# Patient Record
Sex: Female | Born: 1960
Health system: Southern US, Community
[De-identification: ages and names within clinical notes are randomized; demographics above are authoritative.]

## PROBLEM LIST (undated history)

## (undated) DIAGNOSIS — C4490 Unspecified malignant neoplasm of skin, unspecified: Secondary | ICD-10-CM

## (undated) DIAGNOSIS — K219 Gastro-esophageal reflux disease without esophagitis: Secondary | ICD-10-CM

## (undated) DIAGNOSIS — T884XXA Failed or difficult intubation, initial encounter: Secondary | ICD-10-CM

## (undated) DIAGNOSIS — N8500 Endometrial hyperplasia, unspecified: Secondary | ICD-10-CM

## (undated) HISTORY — PX: OTHER SURGICAL HISTORY: SHX169

---

## 1997-08-01 ENCOUNTER — Ambulatory Visit (HOSPITAL_COMMUNITY): Admission: RE | Admit: 1997-08-01 | Discharge: 1997-08-01 | Payer: Self-pay | Admitting: Obstetrics and Gynecology

## 1998-06-27 ENCOUNTER — Ambulatory Visit (HOSPITAL_COMMUNITY): Admission: RE | Admit: 1998-06-27 | Discharge: 1998-06-27 | Payer: Self-pay | Admitting: Gastroenterology

## 1998-10-22 ENCOUNTER — Other Ambulatory Visit: Admission: RE | Admit: 1998-10-22 | Discharge: 1998-10-22 | Payer: Self-pay | Admitting: Obstetrics and Gynecology

## 1999-11-20 ENCOUNTER — Other Ambulatory Visit: Admission: RE | Admit: 1999-11-20 | Discharge: 1999-11-20 | Payer: Self-pay | Admitting: Obstetrics and Gynecology

## 2000-01-19 ENCOUNTER — Ambulatory Visit (HOSPITAL_COMMUNITY): Admission: RE | Admit: 2000-01-19 | Discharge: 2000-01-19 | Payer: Self-pay | Admitting: Family Medicine

## 2000-01-19 ENCOUNTER — Encounter: Payer: Self-pay | Admitting: Family Medicine

## 2001-02-20 ENCOUNTER — Other Ambulatory Visit: Admission: RE | Admit: 2001-02-20 | Discharge: 2001-02-20 | Payer: Self-pay | Admitting: Obstetrics and Gynecology

## 2002-04-27 ENCOUNTER — Other Ambulatory Visit: Admission: RE | Admit: 2002-04-27 | Discharge: 2002-04-27 | Payer: Self-pay | Admitting: Obstetrics and Gynecology

## 2003-05-24 ENCOUNTER — Other Ambulatory Visit: Admission: RE | Admit: 2003-05-24 | Discharge: 2003-05-24 | Payer: Self-pay | Admitting: Obstetrics and Gynecology

## 2003-09-09 ENCOUNTER — Ambulatory Visit (HOSPITAL_COMMUNITY): Admission: RE | Admit: 2003-09-09 | Discharge: 2003-09-09 | Payer: Self-pay | Admitting: Gastroenterology

## 2003-09-09 ENCOUNTER — Encounter (INDEPENDENT_AMBULATORY_CARE_PROVIDER_SITE_OTHER): Payer: Self-pay | Admitting: *Deleted

## 2004-07-09 ENCOUNTER — Other Ambulatory Visit: Admission: RE | Admit: 2004-07-09 | Discharge: 2004-07-09 | Payer: Self-pay | Admitting: Obstetrics and Gynecology

## 2004-09-29 ENCOUNTER — Ambulatory Visit (HOSPITAL_COMMUNITY): Admission: RE | Admit: 2004-09-29 | Discharge: 2004-09-29 | Payer: Self-pay | Admitting: Obstetrics and Gynecology

## 2004-09-29 ENCOUNTER — Encounter (INDEPENDENT_AMBULATORY_CARE_PROVIDER_SITE_OTHER): Payer: Self-pay | Admitting: *Deleted

## 2006-10-25 ENCOUNTER — Ambulatory Visit (HOSPITAL_COMMUNITY): Admission: RE | Admit: 2006-10-25 | Discharge: 2006-10-25 | Payer: Self-pay | Admitting: Gastroenterology

## 2009-09-03 ENCOUNTER — Other Ambulatory Visit: Admission: RE | Admit: 2009-09-03 | Discharge: 2009-09-03 | Payer: Self-pay | Admitting: Family Medicine

## 2009-10-16 ENCOUNTER — Encounter: Admission: RE | Admit: 2009-10-16 | Discharge: 2009-10-16 | Payer: Self-pay | Admitting: Family Medicine

## 2010-06-23 NOTE — Op Note (Signed)
[name], Hannah Woodward                 ACCOUNT NO.:  [id number]   MEDICAL RECORD NO.:  [id number]          PATIENT TYPE:  AMB   LOCATION:  ENDO                         FACILITY:  [facility]   PHYSICIAN:  John C. Madilyn Fireman, M.D.    DATE OF BIRTH:  1960/05/09   DATE OF PROCEDURE:  10/25/2006  DATE OF DISCHARGE:                               OPERATIVE REPORT   PROCEDURE:  Colonoscopy.   ENDOSCOPIST:  Everardo All. Madilyn Fireman, M.D.   INDICATIONS FOR PROCEDURE:  History of adenomatous colon polyps.   PROCEDURE:  The patient was placed in the left lateral decubitus  position and placed on the pulse monitor with continuous low-flow oxygen  delivered by nasal cannula.  She was sedated with 100 mcg of IV fentanyl  and 10 mg of IV Versed.  The Olympus video colonoscope was inserted into  the rectum and advanced to cecum, confirmed by transillumination of  McBurney's point and visualization of the ileocecal valve and  appendiceal orifice.  Prep was excellent.  The cecum, ascending,  transverse, descending and sigmoid colon all appeared normal with no  masses, polyps, diverticula or other mucosal abnormalities.  The rectum  likewise appeared normal.  In retroflexed view, the anus revealed no  obvious internal hemorrhoids.  Scope was then withdrawn and the patient  returned to the recovery room in stable condition.  She tolerated the  procedure well and there were no immediate complications.   IMPRESSION:  Normal colonoscopy.   PLAN:  Repeat study in 5 years.           ______________________________  Everardo All Madilyn Fireman, M.D.     JCH/MEDQ  D:  10/25/2006  T:  10/25/2006  Job:  [id number]

## 2010-06-26 NOTE — Op Note (Signed)
NAMEVIVIEN, Hannah Woodward                 ACCOUNT NO.:  [id number]   MEDICAL RECORD NO.:  [id number]          PATIENT TYPE:  AMB   LOCATION:  SDC                           FACILITY:  [facility]   PHYSICIAN:  Duke Salvia. Marcelle Overlie, M.D.DATE OF BIRTH:  February 06, 1961   DATE OF PROCEDURE:  09/29/2004  DATE OF DISCHARGE:                                 OPERATIVE REPORT   PREOPERATIVE DIAGNOSES:  1.  Abnormal uterine bleeding.  2.  Leiomyoma.   POSTOPERATIVE DIAGNOSES:  1.  Abnormal uterine bleeding.  2.  Leiomyoma.   PROCEDURE:  Dilatation and curettage, attempted NovaSure endometrial  ablation.   SURGEON:  Duke Salvia. Marcelle Overlie, M.D.   ANESTHESIA:  Sedation plus paracervical block.   SPECIMENS REMOVED:  Endometrial curetting.   PROCEDURE AND FINDINGS:  The patient was taken to the operating room.  After  an adequate level of sedation was obtained with the patient's legs in  stirrups, the perineum and vagina were prepped and draped with Betadine.  The bladder was drained, EUA carried out.  The uterus was six weeks' size,  midposition, with excellent support.  Cervix was grasped with a tenaculum,  paracervical block created by infiltrating at 3 and 9 o'clock submucosally.  Lidocaine 1% 5-7 mL on either side after negative aspiration.  The uterus  was then sounded to 9.5-10 cm, was acutely anteflexed.  The cervix was very  tight.  She was dilated to a #27 to 29 Pratt dilator, but there was an acute  angulation of the cervix at the internal os forward.  Cervical length was  2.5-3 cm.  Despite dilating to a 29 Pratt dilator, I could not get the  NovaSure instrument to negotiate the acute angle of her internal os after  several attempts.  There was minimal bleeding.  The procedure was terminated  at that point.  She tolerated this well, went to the recovery room in good  condition.      Richard M. Marcelle Overlie, M.D.  Electronically Signed     RMH/MEDQ  D:  09/29/2004  T:  09/29/2004  Job:  [id number]

## 2010-06-26 NOTE — H&P (Signed)
Hannah Woodward, Hannah Woodward                 ACCOUNT NO.:  [id number]   MEDICAL RECORD NO.:  [id number]          PATIENT TYPE:  AMB   LOCATION:  SDC                           FACILITY:  [facility]   PHYSICIAN:  Duke Salvia. Marcelle Overlie, M.D.DATE OF BIRTH:  10/15/1960   DATE OF ADMISSION:  09/29/2004  DATE OF DISCHARGE:                                HISTORY & PHYSICAL   CHIEF COMPLAINT:  Menorrhagia.   HISTORY OF PRESENT ILLNESS:  A 50 year old G2, P2, prior tubal with a one  year history of menorrhagia.  Her FSH was normal at 7.4.  Sonohistogram done  in June of this year showed several small fibroids 1.1 and 1.2 and a  negative cavity evaluation by saline infusion.  She has declined other  attempts to hormonally regulate her bleeding and presents now for NovaSure  EMA.  This procedure, including risks of bleeding, infection, other  complications that may require additional surgery all discussed, along with  the expected outcome results, 40% amenorrhea rate 80-90% hypomenorrhea rate,  which she understands and accepts.   PAST MEDICAL HISTORY:   ALLERGIES:  None.   OPERATIONS:  1.  Tubal.  2.  Cesarean section.   FAMILY HISTORY:  Significant for her father had colon can and her mother  with breast cancer.   She has been up to date, both on her colonoscopy and mammography screening.   PHYSICAL EXAMINATION:  VITAL SIGNS:  Temperature 98.2, blood pressure  120/68.  HEENT:  Unremarkable.  NECK:  Supple, without masses.  LUNGS:  Clear.  CARDIOVASCULAR:  Regular rate and rhythm without murmurs, rubs or gallops.  BREASTS:  Without masses.  ABDOMEN:  Soft, flat and nontender.  PELVIC:  Normal external genitalia.  Vagina and cervix clear.  Uterus normal  position, normal size.  Adnexa negative.  EXTREMITIES:  Unremarkable.  NEUROLOGIC:  Unremarkable.   IMPRESSION:  Menorrhagia.   PLAN:  NovaSure EMA procedure.  Risks reviewed as above.      Richard M. Marcelle Overlie, M.D.  Electronically  Signed     RMH/MEDQ  D:  09/28/2004  T:  09/28/2004  Job:  [id number]

## 2010-06-26 NOTE — Op Note (Signed)
NAMESEPHORA, Hannah Woodward                           ACCOUNT NO.:  [id number]   MEDICAL RECORD NO.:  [id number]                   PATIENT TYPE:  AMB   LOCATION:  ENDO                                 FACILITY:  [facility]   PHYSICIAN:  John C. Madilyn Fireman, M.D.                 DATE OF BIRTH:  13-Nov-1960   DATE OF PROCEDURE:  09/09/2003  DATE OF DISCHARGE:                                 OPERATIVE REPORT   Esophagogastroduodenoscopy.   INDICATION FOR PROCEDURE:  Intermittent solid food dysphagia.   PROCEDURE:  This patient was placed in the left lateral decubitus position  and placed on pulse monitor with continuous low flow oxygen delivered by  nasal cannula.  She was sedated with 75 mcg IV fentanyl and 7 mg IV Versed.  The Olympus video endoscope was advanced under direct vision into the  oropharynx and esophagus.  The esophagus was straight and of normal caliber  with squamocolumnar line at 38 cm.  Although it was subtle, there did appear  to be a minimal lower esophageal ring with no resistance with passage of the  scope beyond.  The stomach was entered and a small amount of liquid  secretions were suctioned from the fundus.  A retroflexed view of the cardia  was unremarkable.  The fundus, body, antrum and pylorus all appeared normal.  The duodenum was entered and both the bulb and second portion were well  inspected and appeared to be within normal limits.  A Savary guide wire was  placed through the endoscope channel and the scope withdrawn.  A single 18  mm Savary dilator was passed over the guide wire to moderate resistance and  removed together with the wire.  There was minimal blood seen on withdrawal.  The patient was then prepared for colonoscopy as previously scheduled.  She  tolerated the procedure well and there were no immediate complications.   IMPRESSION:  Subtle lower esophageal ring with dysphagia status post  dilatation to 18 mm.   PLAN:  Advance diet and observe response to  dilatation.                                               John C. Madilyn Fireman, M.D.    JCH/MEDQ  D:  09/09/2003  T:  09/09/2003  Job:  [id number]   cc:   Donia Guiles, M.D.  [address] [place]  [place] [address]  Fax: [phone]

## 2010-06-26 NOTE — Op Note (Signed)
Hannah Woodward, Hannah Woodward                           ACCOUNT NO.:  [id number]   MEDICAL RECORD NO.:  [id number]                   PATIENT TYPE:  AMB   LOCATION:  ENDO                                 FACILITY[facility]  [facility]   PHYSICIAN:  John C. Madilyn Fireman, M.D.                 DATE OF BIRTH:  1960/02/15   DATE OF PROCEDURE:  09/09/2003  DATE OF DISCHARGE:                                 OPERATIVE REPORT   PROCEDURE PERFORMED:  Colonoscopy.   ENDOSCOPIST:  Barrie Folk, M.D.   INDICATIONS FOR PROCEDURE:  Family history of colon cancer in a first degree  relative, with last colonoscopy five years ago.   DESCRIPTION OF PROCEDURE:  The patient was placed in the left lateral  decubitus position and placed on the pulse monitor with continuous low-flow  oxygen delivered by nasal cannula.  The patient was sedated with 25 mcg of  IV fentanyl and 3 mg of IV Versed in addition to the medicines given for the  previous EGD.  The Olympus video colonoscope was inserted into the rectum  and advanced to the cecum, confirmed by transillumination of McBurney's  point and visualization of the ileocecal valve and appendiceal orifice.  The  prep was excellent.  The cecum appeared normal with no masses, polyps,  diverticula or other mucosal abnormalities.  In the ascending colon there  was an 8 mm sessile polyp that was fulgurated by hot biopsy.  The remainder  of the ascending, transverse, descending, sigmoid and rectum appeared normal  with no further masses, polyps, diverticula or other mucosal abnormalities.  The scope was then withdrawn and the patient returned to the recovery room  in stable condition.  She tolerated the procedure well.  There were no  immediate complications.   IMPRESSION:  Ascending colon polyp.   PLAN:  Await histology and will probably repeat study in three to five years  depending on the histology.                                               John C. Madilyn Fireman, M.D.    JCH/MEDQ  D:   09/09/2003  T:  09/09/2003  Job:  [id number]

## 2010-08-31 ENCOUNTER — Other Ambulatory Visit: Payer: Self-pay | Admitting: Dermatology

## 2010-10-06 ENCOUNTER — Other Ambulatory Visit: Payer: Self-pay | Admitting: Family Medicine

## 2010-10-06 DIAGNOSIS — Z1231 Encounter for screening mammogram for malignant neoplasm of breast: Secondary | ICD-10-CM

## 2010-10-23 ENCOUNTER — Other Ambulatory Visit (HOSPITAL_COMMUNITY)
Admission: RE | Admit: 2010-10-23 | Discharge: 2010-10-23 | Disposition: A | Discharge status: Home / Self Care | Payer: 59 | Source: Ambulatory Visit | Attending: Family Medicine | Admitting: Family Medicine

## 2010-10-23 DIAGNOSIS — Z124 Encounter for screening for malignant neoplasm of cervix: Secondary | ICD-10-CM | POA: Insufficient documentation

## 2010-10-28 ENCOUNTER — Other Ambulatory Visit: Payer: Self-pay | Admitting: Physician Assistant

## 2010-10-28 ENCOUNTER — Ambulatory Visit
Admission: RE | Admit: 2010-10-28 | Discharge: 2010-10-28 | Disposition: A | Discharge status: Home / Self Care | Payer: 59 | Source: Ambulatory Visit | Attending: Family Medicine | Admitting: Family Medicine

## 2010-10-28 DIAGNOSIS — Z1231 Encounter for screening mammogram for malignant neoplasm of breast: Secondary | ICD-10-CM

## 2011-09-27 ENCOUNTER — Other Ambulatory Visit: Payer: Self-pay | Admitting: Family Medicine

## 2011-09-27 DIAGNOSIS — Z1231 Encounter for screening mammogram for malignant neoplasm of breast: Secondary | ICD-10-CM

## 2011-10-29 ENCOUNTER — Ambulatory Visit
Admission: RE | Admit: 2011-10-29 | Discharge: 2011-10-29 | Disposition: A | Discharge status: Home / Self Care | Payer: 59 | Source: Ambulatory Visit | Attending: Family Medicine | Admitting: Family Medicine

## 2011-10-29 ENCOUNTER — Other Ambulatory Visit: Payer: Self-pay | Admitting: Physician Assistant

## 2011-10-29 ENCOUNTER — Other Ambulatory Visit (HOSPITAL_COMMUNITY)
Admission: RE | Admit: 2011-10-29 | Discharge: 2011-10-29 | Disposition: A | Discharge status: Home / Self Care | Payer: 59 | Source: Ambulatory Visit | Attending: Family Medicine | Admitting: Family Medicine

## 2011-10-29 DIAGNOSIS — Z124 Encounter for screening for malignant neoplasm of cervix: Secondary | ICD-10-CM | POA: Insufficient documentation

## 2011-10-29 DIAGNOSIS — Z1231 Encounter for screening mammogram for malignant neoplasm of breast: Secondary | ICD-10-CM

## 2012-03-25 ENCOUNTER — Other Ambulatory Visit: Payer: Self-pay

## 2012-09-04 ENCOUNTER — Other Ambulatory Visit (HOSPITAL_COMMUNITY): Payer: Self-pay | Admitting: Physician Assistant

## 2012-09-04 DIAGNOSIS — R109 Unspecified abdominal pain: Secondary | ICD-10-CM

## 2012-09-05 ENCOUNTER — Ambulatory Visit (HOSPITAL_COMMUNITY)
Admission: RE | Admit: 2012-09-05 | Discharge: 2012-09-05 | Disposition: A | Discharge status: Home / Self Care | Payer: 59 | Source: Ambulatory Visit | Attending: Physician Assistant | Admitting: Physician Assistant

## 2012-09-05 DIAGNOSIS — R110 Nausea: Secondary | ICD-10-CM | POA: Insufficient documentation

## 2012-09-05 DIAGNOSIS — Z85828 Personal history of other malignant neoplasm of skin: Secondary | ICD-10-CM | POA: Insufficient documentation

## 2012-09-05 DIAGNOSIS — K7689 Other specified diseases of liver: Secondary | ICD-10-CM | POA: Insufficient documentation

## 2012-09-05 DIAGNOSIS — R109 Unspecified abdominal pain: Secondary | ICD-10-CM | POA: Insufficient documentation

## 2012-09-06 ENCOUNTER — Other Ambulatory Visit (HOSPITAL_COMMUNITY): Payer: 59

## 2012-09-07 ENCOUNTER — Other Ambulatory Visit (HOSPITAL_COMMUNITY): Payer: Self-pay | Admitting: Physician Assistant

## 2012-09-07 DIAGNOSIS — R935 Abnormal findings on diagnostic imaging of other abdominal regions, including retroperitoneum: Secondary | ICD-10-CM

## 2012-09-08 ENCOUNTER — Ambulatory Visit (HOSPITAL_COMMUNITY)
Admission: RE | Admit: 2012-09-08 | Discharge: 2012-09-08 | Disposition: A | Discharge status: Home / Self Care | Payer: 59 | Source: Ambulatory Visit | Attending: Physician Assistant | Admitting: Physician Assistant

## 2012-09-08 DIAGNOSIS — R935 Abnormal findings on diagnostic imaging of other abdominal regions, including retroperitoneum: Secondary | ICD-10-CM

## 2012-09-08 DIAGNOSIS — K7689 Other specified diseases of liver: Secondary | ICD-10-CM | POA: Insufficient documentation

## 2012-09-08 MED ORDER — GADOXETATE DISODIUM 0.25 MMOL/ML IV SOLN
9.0000 mL | Freq: Once | INTRAVENOUS | Status: AC | PRN
  Administered 2012-09-08: 9 mL via INTRAVENOUS

## 2012-11-09 ENCOUNTER — Other Ambulatory Visit: Payer: Self-pay

## 2012-11-09 DIAGNOSIS — Z1231 Encounter for screening mammogram for malignant neoplasm of breast: Secondary | ICD-10-CM

## 2012-11-10 ENCOUNTER — Ambulatory Visit
Admission: RE | Admit: 2012-11-10 | Discharge: 2012-11-10 | Disposition: A | Discharge status: Home / Self Care | Payer: 59 | Source: Ambulatory Visit

## 2012-11-10 DIAGNOSIS — Z1231 Encounter for screening mammogram for malignant neoplasm of breast: Secondary | ICD-10-CM

## 2012-12-14 ENCOUNTER — Other Ambulatory Visit: Payer: Self-pay

## 2013-01-24 ENCOUNTER — Other Ambulatory Visit: Payer: Self-pay | Admitting: *Deleted

## 2013-01-24 DIAGNOSIS — R002 Palpitations: Secondary | ICD-10-CM

## 2013-01-24 NOTE — Progress Notes (Signed)
24 hour holter monitor ordered at the request of Dr Maurice Small for palpitations.

## 2013-01-29 ENCOUNTER — Encounter (INDEPENDENT_AMBULATORY_CARE_PROVIDER_SITE_OTHER): Payer: 59

## 2013-01-29 ENCOUNTER — Encounter: Payer: Self-pay | Admitting: *Deleted

## 2013-01-29 DIAGNOSIS — R002 Palpitations: Secondary | ICD-10-CM

## 2013-01-29 NOTE — Progress Notes (Signed)
Patient ID: Hannah Woodward, female   DOB: 02/27/1960, 52 y.o.   MRN: [id number] E-Cardio 24 hour holter monitor applied to patient.

## 2013-12-28 ENCOUNTER — Other Ambulatory Visit: Payer: Self-pay

## 2013-12-28 DIAGNOSIS — Z1231 Encounter for screening mammogram for malignant neoplasm of breast: Secondary | ICD-10-CM

## 2014-01-25 ENCOUNTER — Ambulatory Visit
Admission: RE | Admit: 2014-01-25 | Discharge: 2014-01-25 | Disposition: A | Discharge status: Home / Self Care | Payer: 59 | Source: Ambulatory Visit

## 2014-01-25 DIAGNOSIS — Z1231 Encounter for screening mammogram for malignant neoplasm of breast: Secondary | ICD-10-CM

## 2014-10-30 ENCOUNTER — Other Ambulatory Visit: Payer: Self-pay | Admitting: Gastroenterology

## 2014-11-05 ENCOUNTER — Ambulatory Visit (HOSPITAL_COMMUNITY)
Admission: RE | Admit: 2014-11-05 | Discharge: 2014-11-05 | Disposition: A | Discharge status: Home / Self Care | Payer: 59 | Source: Ambulatory Visit | Attending: Gastroenterology | Admitting: Gastroenterology

## 2014-11-05 ENCOUNTER — Encounter (HOSPITAL_COMMUNITY): Payer: Self-pay | Admitting: *Deleted

## 2014-11-05 ENCOUNTER — Encounter (HOSPITAL_COMMUNITY)
Admission: RE | Disposition: A | Discharge status: Home / Self Care | Payer: Self-pay | Source: Ambulatory Visit | Attending: Gastroenterology

## 2014-11-05 DIAGNOSIS — K449 Diaphragmatic hernia without obstruction or gangrene: Secondary | ICD-10-CM | POA: Insufficient documentation

## 2014-11-05 DIAGNOSIS — K2950 Unspecified chronic gastritis without bleeding: Secondary | ICD-10-CM | POA: Insufficient documentation

## 2014-11-05 DIAGNOSIS — K219 Gastro-esophageal reflux disease without esophagitis: Secondary | ICD-10-CM | POA: Insufficient documentation

## 2014-11-05 DIAGNOSIS — Z800 Family history of malignant neoplasm of digestive organs: Secondary | ICD-10-CM | POA: Diagnosis not present

## 2014-11-05 DIAGNOSIS — R1110 Vomiting, unspecified: Secondary | ICD-10-CM | POA: Diagnosis present

## 2014-11-05 DIAGNOSIS — K2980 Duodenitis without bleeding: Secondary | ICD-10-CM | POA: Insufficient documentation

## 2014-11-05 HISTORY — DX: Gastro-esophageal reflux disease without esophagitis: K21.9

## 2014-11-05 HISTORY — PX: ESOPHAGOGASTRODUODENOSCOPY: SHX5428

## 2014-11-05 HISTORY — DX: Unspecified malignant neoplasm of skin, unspecified: C44.90

## 2014-11-05 HISTORY — PX: COLONOSCOPY: SHX5424

## 2014-11-05 SURGERY — EGD (ESOPHAGOGASTRODUODENOSCOPY)
Anesthesia: Moderate Sedation

## 2014-11-05 MED ORDER — SODIUM CHLORIDE 0.9 % IV SOLN: INTRAVENOUS | Status: DC

## 2014-11-05 MED ORDER — FENTANYL CITRATE (PF) 100 MCG/2ML IJ SOLN
INTRAMUSCULAR | Status: DC | PRN
  Administered 2014-11-05 (×3): 25 ug via INTRAVENOUS

## 2014-11-05 MED ORDER — FENTANYL CITRATE (PF) 100 MCG/2ML IJ SOLN
INTRAMUSCULAR | Status: AC
  Filled 2014-11-05: qty 2

## 2014-11-05 MED ORDER — DIPHENHYDRAMINE HCL 50 MG/ML IJ SOLN
INTRAMUSCULAR | Status: AC
  Filled 2014-11-05: qty 1

## 2014-11-05 MED ORDER — MIDAZOLAM HCL 10 MG/2ML IJ SOLN
INTRAMUSCULAR | Status: DC | PRN
  Administered 2014-11-05: 2 mg via INTRAVENOUS

## 2014-11-05 MED ORDER — MIDAZOLAM HCL 5 MG/ML IJ SOLN
INTRAMUSCULAR | Status: AC
  Filled 2014-11-05: qty 2

## 2014-11-05 MED ORDER — SODIUM CHLORIDE 0.9 % IV SOLN
INTRAVENOUS | Status: DC
  Administered 2014-11-05 (×2): 500 mL via INTRAVENOUS

## 2014-11-05 MED ORDER — MIDAZOLAM HCL 5 MG/5ML IJ SOLN
INTRAMUSCULAR | Status: DC | PRN
  Administered 2014-11-05: 1 mg via INTRAVENOUS
  Administered 2014-11-05 (×4): 2 mg via INTRAVENOUS

## 2014-11-05 MED ORDER — BUTAMBEN-TETRACAINE-BENZOCAINE 2-2-14 % EX AERO
INHALATION_SPRAY | CUTANEOUS | Status: DC | PRN
  Administered 2014-11-05: 2 via TOPICAL

## 2014-11-05 NOTE — Op Note (Signed)
[facility] [place], [address]   ENDOSCOPY PROCEDURE REPORT  PATIENT: Hannah Woodward, Hannah Woodward  MR#: [id number] BIRTHDATE: Jun 27, 1960 , 54  yrs. old GENDER: female ENDOSCOPIST: Teena Irani, MD REFERRED BY: PROCEDURE DATE:  2014-11-27 PROCEDURE: ASA CLASS: INDICATIONS:  dyspepsia MEDICATIONS: fentanyl 50 g, Versed 6 mg TOPICAL ANESTHETIC: Cetacaine spray  DESCRIPTION OF PROCEDURE: After the risks benefits and alternatives of the procedure were thoroughly explained, informed consent was obtained.  The Pentax Gastroscope [id number] endoscope was introduced through the mouth and advanced to the second portion of the duodenum , Without limitations.  The instrument was slowly withdrawn as the mucosa was fully examined. Estimated blood loss is zero unless otherwise noted in this procedure report.    esophagus: Small to moderate size hiatal hernia with some reflux demonstrated otherwise normal Stomach: Minimal distal antral gastritis, biopsies obtained to rule out H. pylori. Duodenum: Minimal bulbar duodenitis.              The scope was then withdrawn from the patient and the procedure completed.  COMPLICATIONS: There were no immediate complications.  ENDOSCOPIC IMPRESSION: sliding hiatal hernia with reflux Minimal distal antral gastritis and bulbar duodenitis  RECOMMENDATIONS: await biopsies to rule out H. pylori and will treat if positive. Otherwise, dietary lifestyle and medical treatment for acid reflux.  REPEAT EXAM:  eSigned:  Teena Irani, MD 2014-11-27 9:41 AM    CC:  CPT CODES: ICD CODES:  The ICD and CPT codes recommended by this software are interpretations from the data that the clinical staff has captured with the software.  The verification of the translation of this report to the ICD and CPT codes and modifiers is the sole responsibility of the health care institution and practicing physician where this report was  generated.  [employer] will not be held responsible for the validity of the ICD and CPT codes included on this report.  AMA assumes no liability for data contained or not contained herein. CPT is a Designer, television/film set of the [employer].  PATIENT NAME:  Hikari, Tripp MR#: [id number]

## 2014-11-05 NOTE — Addendum Note (Signed)
Addended by: Teena Irani on: 11/05/2014 09:10 AM   Modules accepted: Orders

## 2014-11-05 NOTE — H&P (Signed)
   [facility] Admission History & Physical  Chief Complaint: Epigastric burning and regurgitation HPI: Hannah Woodward is an 54 y.o. black female.  With strong family history of colon cancer. She presents for colonoscopy. She is also complaining of a lot of dyspepsia of relatively recent onset. She does not like to take medication and we have decided to pursue endoscopy. She is not having any dysphagia although she has had an empiric dilatation for dysphagia in the past.  Past Medical History  Diagnosis Date  . GERD (gastroesophageal reflux disease)   . Skin cancer     Past Surgical History  Procedure Laterality Date  . Cesarean section      No prescriptions prior to admission    Allergies: Not on File  History reviewed. No pertinent family history.  Social History:  reports that she has never smoked. She does not have any smokeless tobacco history on file. She reports that she drinks alcohol. She reports that she does not use illicit drugs.  Review of Systems: negative except as above   Blood pressure 150/69, pulse 81, temperature 98.6 F (37 C), temperature source Oral, resp. rate 17, height 5\' 2"  (1.575 m), weight 84.369 kg (186 lb), last menstrual period 10/09/2014, SpO2 100 %. Head: Normocephalic, without obvious abnormality, atraumatic Neck: no adenopathy, no carotid bruit, no JVD, supple, symmetrical, trachea midline and thyroid not enlarged, symmetric, no tenderness/mass/nodules Resp: clear to auscultation bilaterally Cardio: regular rate and rhythm, S1, S2 normal, no murmur, click, rub or gallop GI: Abdomen soft nondistended with normoactive bowel sounds. No hepatosplenomegaly mass or guarding Extremities: extremities normal, atraumatic, no cyanosis or edema  No results found for this or any previous visit (from the past 48 hour(s)). No results found.  Assessment: Family history of colon cancer Reflux-like dyspepsia. Plan: Proceed with EGD and  colonoscopy.  HAYES,JOHN C 11/05/2014, 9:12 AM

## 2014-11-05 NOTE — Discharge Instructions (Signed)
Esophagogastroduodenoscopy °Care After °Refer to this sheet in the next few weeks. These instructions provide you with information on caring for yourself after your procedure. Your caregiver may also give you more specific instructions. Your treatment has been planned according to current medical practices, but problems sometimes occur. Call your caregiver if you have any problems or questions after your procedure.  °HOME CARE INSTRUCTIONS °· Do not eat or drink anything until the numbing medicine (local anesthetic) has worn off and your gag reflex has returned. You will know that the local anesthetic has worn off when you can swallow comfortably. °· Do not drive for 12 hours after the procedure or as directed by your caregiver. °· Only take medicines as directed by your caregiver. °SEEK MEDICAL CARE IF:  °· You cannot stop coughing. °· You are not urinating at all or less than usual. °SEEK IMMEDIATE MEDICAL CARE IF: °· You have difficulty swallowing. °· You cannot eat or drink. °· You have worsening throat or chest pain. °· You have dizziness, lightheadedness, or you faint. °· You have nausea or vomiting. °· You have chills. °· You have a fever. °· You have severe abdominal pain. °· You have black, tarry, or bloody stools. °Document Released: 01/12/2012 Document Reviewed: 01/12/2012 °ExitCare® Patient Information ©2015 [employer]. This information is not intended to replace advice given to you by your health care provider. Make sure you discuss any questions you have with your health care provider. °Colonoscopy, Care After °These instructions give you information on caring for yourself after your procedure. Your doctor may also give you more specific instructions. Call your doctor if you have any problems or questions after your procedure. °HOME CARE °· Do not drive for 24 hours. °· Do not sign important papers or use machinery for 24 hours. °· You may shower. °· You may go back to your usual activities, but go  slower for the first 24 hours. °· Take rest breaks often during the first 24 hours. °· Walk around or use warm packs on your belly (abdomen) if you have belly cramping or gas. °· Drink enough fluids to keep your pee (urine) clear or pale yellow. °· Resume your normal diet. Avoid heavy or fried foods. °· Avoid drinking alcohol for 24 hours or as told by your doctor. °· Only take medicines as told by your doctor. °If a tissue sample (biopsy) was taken during the procedure:  °· Do not take aspirin or blood thinners for 7 days, or as told by your doctor. °· Do not drink alcohol for 7 days, or as told by your doctor. °· Eat soft foods for the first 24 hours. °GET HELP IF: °You still have a small amount of blood in your poop (stool) 2-3 days after the procedure. °GET HELP RIGHT AWAY IF: °· You have more than a small amount of blood in your poop. °· You see clumps of tissue (blood clots) in your poop. °· Your belly is puffy (swollen). °· You feel sick to your stomach (nauseous) or throw up (vomit). °· You have a fever. °· You have belly pain that gets worse and medicine does not help. °MAKE SURE YOU: °· Understand these instructions. °· Will watch your condition. °· Will get help right away if you are not doing well or get worse. °Document Released: 02/27/2010 Document Revised: 01/30/2013 Document Reviewed: 10/02/2012 °ExitCare® Patient Information ©2015 [employer]. This information is not intended to replace advice given to you by your health care provider. Make sure you   discuss any questions you have with your health care provider. ° °

## 2014-11-05 NOTE — Op Note (Signed)
[facility] [place], [address]   COLONOSCOPY PROCEDURE REPORT     EXAM DATE: Nov 29, 2014  PATIENT NAME:      Hannah Woodward, Hannah Woodward           MR #:      [id number]  BIRTHDATE:       1960/12/25      VISIT #:     [phone]  ATTENDING:     Teena Irani, MD     STATUS:     outpatient ASSISTANT:      Cleda Daub, Monday, Sarah, and Cristopher Estimable   INDICATIONS:  The patient is a 54 yr old female here for a colonoscopy due to PROCEDURE PERFORMED: MEDICATIONS:     fentanyl 25 g, Versed 3 mg ESTIMATED BLOOD LOSS:     None  CONSENT: The patient understands the risks and benefits of the procedure and understands that these risks include, but are not limited to: sedation, allergic reaction, infection, perforation and/or bleeding. Alternative means of evaluation and treatment include, among others: physical exam, x-rays, and/or surgical intervention. The patient elects to proceed with this endoscopic procedure.  DESCRIPTION OF PROCEDURE: During intra-op preparation period all mechanical & medical equipment was checked for proper function. Hand hygiene and appropriate measures for infection prevention was taken. After the risks, benefits and alternatives of the procedure were thoroughly explained, Informed consent was verified, confirmed and timeout was successfully executed by the treatment team. A digital exam    The Pentax Ped Colon [id number] endoscope was introduced through the anus and advanced to the cecum     . the prep quality was excellent The instrument was then slowly withdrawn as the colon was fully examined.Estimated blood loss is zero unless otherwise noted in this procedure report. completely normal exam      retroflex view the anus was unremarkable      The scope was then completely withdrawn from the patient and the procedure terminated. SCOPE WITHDRAWAL TIME:    ADVERSE EVENTS:      There were no immediate  complications.  IMPRESSIONS:     normal study.  RECOMMENDATIONS:     repeat colonoscopy in 5 years. RECALL:  _____________________________ Teena Irani, MD eSigned:  Teena Irani, MD 29-Nov-2014 9:55 AM   cc:   CPT CODES: ICD CODES:  The ICD and CPT codes recommended by this software are interpretations from the data that the clinical staff has captured with the software.  The verification of the translation of this report to the ICD and CPT codes and modifiers is the sole responsibility of the health care institution and practicing physician where this report was generated.  [place]. will not be held responsible for the validity of the ICD and CPT codes included on this report.  AMA assumes no liability for data contained or not contained herein. CPT is a Designer, television/film set of the [employer].

## 2014-11-07 ENCOUNTER — Encounter (HOSPITAL_COMMUNITY): Payer: Self-pay | Admitting: Gastroenterology

## 2014-12-12 ENCOUNTER — Ambulatory Visit (HOSPITAL_COMMUNITY)
Admission: RE | Admit: 2014-12-12 | Discharge: 2014-12-12 | Disposition: A | Discharge status: Home / Self Care | Payer: 59 | Source: Ambulatory Visit | Attending: Family Medicine | Admitting: Family Medicine

## 2014-12-12 ENCOUNTER — Other Ambulatory Visit (HOSPITAL_COMMUNITY): Payer: Self-pay | Admitting: Family Medicine

## 2014-12-12 DIAGNOSIS — M545 Low back pain, unspecified: Secondary | ICD-10-CM

## 2014-12-30 ENCOUNTER — Encounter: Payer: 59 | Admitting: Physical Therapy

## 2014-12-31 ENCOUNTER — Ambulatory Visit: Payer: 59 | Attending: Family Medicine | Admitting: Physical Therapy

## 2014-12-31 DIAGNOSIS — M5441 Lumbago with sciatica, right side: Secondary | ICD-10-CM | POA: Diagnosis present

## 2014-12-31 DIAGNOSIS — G729 Myopathy, unspecified: Secondary | ICD-10-CM | POA: Diagnosis present

## 2014-12-31 DIAGNOSIS — M6289 Other specified disorders of muscle: Secondary | ICD-10-CM

## 2014-12-31 NOTE — Therapy (Addendum)
[place], [place], [address] Phone: [phone]   Fax:  [phone]  Physical Therapy Evaluation/Discharge Patient Details  Name: Hannah Woodward MRN: [id number] Date of Birth: March 22, 1960 Referring Provider: Laurann Montana  Encounter Date: 12/31/2014      PT End of Session - 12/31/14 1547    Visit Number 1   Number of Visits 6   Date for PT Re-Evaluation 02/11/15   PT Start Time 1500   PT Stop Time 1540   PT Time Calculation (min) 40 min   Activity Tolerance Patient tolerated treatment well   Behavior During Therapy [facility] for tasks assessed/performed      Past Medical History  Diagnosis Date  . GERD (gastroesophageal reflux disease)   . Skin cancer     Past Surgical History  Procedure Laterality Date  . Cesarean section    . Esophagogastroduodenoscopy N/A 11/05/2014    Procedure: ESOPHAGOGASTRODUODENOSCOPY (EGD);  Surgeon: Teena Irani, MD;  Location: [facility];  Service: Endoscopy;  Laterality: N/A;  courney  . Colonoscopy N/A 11/05/2014    Procedure: COLONOSCOPY;  Surgeon: Teena Irani, MD;  Location: [place];  Service: Endoscopy;  Laterality: N/A;    There were no vitals filed for this visit.  Visit Diagnosis:  Right-sided low back pain with right-sided sciatica  Muscle stiffness      Subjective Assessment - 12/31/14 1508    Subjective Patient reports onset of LBP after an intense exercise class and then playing in a bowling tournament.  She received a prednisone taper and has felt some improvement.  She cont to have diff sitting, sleeping (wakes at night, pain with turning over), she can stand and walk as needed without pain.  She would like to resume exercise that is safe and does not increase her pain.    Limitations Sitting;Other (comment)  Sleep   How long can you sit comfortably? not comfortable ever   How long can you stand comfortably? not limited    How long can you walk comfortably?  not limited   Diagnostic tests XR neg   Patient Stated Goals Pt interested in losing weight and wants to be pain free   Currently in Pain? Yes   Pain Score 3    Pain Location Back   Pain Orientation Right   Pain Descriptors / Indicators Aching;Sore;Tightness  locking   Pain Type Acute pain  subacute   Pain Radiating Towards Rt. leg (prox thigh and ant leg)   Pain Onset More than a month ago   Pain Frequency Intermittent   Aggravating Factors  sitting    Pain Relieving Factors standing, walking   Effect of Pain on Daily Activities not comfortable.    Multiple Pain Sites No            [facility] PT Assessment - 12/31/14 1513    Assessment   Medical Diagnosis sciatica   Referring Provider Laurann Montana   Onset Date/Surgical Date --  6 weeks   Prior Therapy No    Precautions   Precautions None   Restrictions   Weight Bearing Restrictions No   Balance Screen   Has the patient fallen in the past 6 months No   Has the patient had a decrease in activity level because of a fear of falling?  No   Is the patient reluctant to leave their home because of a fear of falling?  No   Prior Function   Level of Independence Independent   Cognition  Overall Cognitive Status Within Functional Limits for tasks assessed   Observation/Other Assessments   Focus on Therapeutic Outcomes (FOTO)  23% limited   Sensation   Light Touch Appears Intact   Coordination   Gross Motor Movements are Fluid and Coordinated Not tested   Posture/Postural Control   Posture/Postural Control Postural limitations   Postural Limitations Increased lumbar lordosis   AROM   AROM Assessment Site --  WNL lumbar    Strength   Right Hip Flexion 5/5   Right Hip Extension 4/5   Right Hip ABduction 4+/5   Left Hip Flexion 5/5   Left Hip Extension 4/5   Left Hip ABduction 4+/5   Right Knee Flexion 5/5   Right Knee Extension 5/5   Left Knee Flexion 5/5   Left Knee Extension 5/5   Right Ankle Dorsiflexion 5/5   Left  Ankle Dorsiflexion 5/5   Palpation   Spinal mobility hypomobile thoracic and low lumbar    Palpation comment no pain, tightness in lumabr paraspinals, none in SIJ and glutes    Special Tests   Lumbar Tests Straight Leg Raise   Straight Leg Raise   Findings Negative   Side  Left  and Rt.    Comment min pain on Rt. side with L SLR            PT Education - 12/31/14 1547    Education provided Yes   Education Details PT/POC, HEP, differential diagnosis   Person(s) Educated Patient   Methods Explanation;Demonstration;Handout   Comprehension Verbalized understanding;Returned demonstration             PT Long Term Goals - 12/31/14 2142    PT LONG TERM GOAL #1   Title Pt will be I with concepts of posture, body mechanics and lifting to prevent further injury at work and with exercise.    Time 6   Period Weeks   Status New   PT LONG TERM GOAL #2   Title Pt will be I with HEP for core strength and trunk flexibility.    Time 6   Period Weeks   Status New   PT LONG TERM GOAL #3   Title Pt will be able to report less waking at night from low back pain , improved 50%    Time 6   Period Weeks   Status New   PT LONG TERM GOAL #4   Title Pt will be able to sit for up to 30 min for meals, rest and socialization with min increase in back pain.     Time 6   Period Weeks               Plan - 12/31/14 2135    Clinical Impression Statement Patient presents with sign and symptoms consistent with muscle strain vs minor disc pathology.  She has min weakness in core, hips and definite stiffness in bilateral hips, spine.  She was reminded to use abdominal support in standing and given HEP to do.  She was reluctant to come today but would like to resolve nagging pain in lumbar spine with sitting.    Pt will benefit from skilled therapeutic intervention in order to improve on the following deficits Increased fascial restricitons;Obesity;Pain;Improper body mechanics;Impaired  flexibility;Decreased mobility;Decreased strength;Postural dysfunction   Rehab Potential Excellent   PT Frequency 1x / week   PT Duration 6 weeks   PT Treatment/Interventions Ultrasound;Neuromuscular re-education;Passive range of motion;Patient/family education;Dry needling;Manual techniques;Therapeutic exercise;Moist Heat;Traction;Therapeutic activities;Functional mobility training;Electrical Stimulation;Cryotherapy  PT Next Visit Plan Review HEP, manual/STW for lumbar spine    PT Home Exercise Plan basic back   Consulted and Agree with Plan of Care Patient         Problem List There are no active problems to display for this patient.   Ameliana Brashear 12/31/2014, 9:49 PM  [facility] [address] [place], [place], [address] Phone: [phone]   Fax:  [phone]  Name: BREKLYNN WOLLAM MRN: [id number] Date of Birth: 06-04-1960  Raeford Razor, PT 12/31/2014 9:50 PM Phone: [phone] Fax: [phone]   Patient did not return after evaluation.  Raeford Razor, PT 09/22/15 3:16 PM Phone: [phone] Fax: [phone]

## 2014-12-31 NOTE — Patient Instructions (Signed)
   Abdominal Bracing With Pelvic Floor (Hook-Lying)    With neutral spine, tighten pelvic floor and abdominals. Repeat _10__ times. Do __2-4_ times a day.   Copyright  VHI. All rights reserved.     Copyright  VHI. All rights reserved.   Pelvic Tilt   Flatten back by rocking pelvis back (and forth) Repeat ___10_ times per set. Do __1__ sets per session. Do __2__ sessions per day.  http://orth.exer.us/134   Copyright  VHI. All rights reserved. Knee to Chest (Flexion)   Pull knee toward chest. Feel stretch in lower back or buttock area. Breathing deeply, Hold __30__ seconds. Repeat with other knee. Repeat __2-3__ times. Do _2___ sessions per day.  http://gt2.exer.us/225   Copyright  VHI. All rights reserved.   Lower Trunk Rotation Stretch   Keeping back flat and feet together, rotate knees to left side. Hold _10-15__ seconds. Repeat ___10_ times per set. Do ___1-2_ sets per session. Do __2__ sessions per day.  http://orth.exer.us/122   Copyright  VHI. All rights reserved.  Supine: Leg Stretch With Strap (Basic)   Lie on back with one knee bent, foot flat on floor. Hook strap around other foot. Straighten knee. Keep knee level with other knee. Hold _30__ seconds. Relax leg completely down to floor.  Repeat __3_ times per session. Do __1_ sessions per day.  Copyright  VHI. All rights reserved.  Standing Arch (Extension)   Place hands in small of back. Using hands as fulcrum, arch backward. Try to keep knees straight. Great exercise if sitting makes pain worse. Use to break up long periods of sitting. Repeat __2-3__ times. Do __1__ sessions per day.  http://gt2.exer.us/247   Copyright  VHI. All rights reserved.  Straight Leg Calf Stretch (Gastroc)

## 2015-04-28 ENCOUNTER — Other Ambulatory Visit: Payer: Self-pay | Admitting: Obstetrics and Gynecology

## 2015-04-28 ENCOUNTER — Encounter (HOSPITAL_COMMUNITY): Payer: Self-pay | Admitting: *Deleted

## 2015-05-12 ENCOUNTER — Encounter (HOSPITAL_COMMUNITY): Payer: Self-pay | Admitting: *Deleted

## 2015-05-12 ENCOUNTER — Ambulatory Visit (HOSPITAL_COMMUNITY): Payer: 59 | Admitting: Anesthesiology

## 2015-05-12 ENCOUNTER — Encounter (HOSPITAL_COMMUNITY)
Admission: RE | Disposition: A | Discharge status: Home / Self Care | Payer: Self-pay | Source: Ambulatory Visit | Attending: Obstetrics and Gynecology

## 2015-05-12 ENCOUNTER — Ambulatory Visit (HOSPITAL_COMMUNITY)
Admission: RE | Admit: 2015-05-12 | Discharge: 2015-05-12 | Disposition: A | Discharge status: Home / Self Care | Payer: 59 | Source: Ambulatory Visit | Attending: Obstetrics and Gynecology | Admitting: Obstetrics and Gynecology

## 2015-05-12 DIAGNOSIS — N8501 Benign endometrial hyperplasia: Secondary | ICD-10-CM | POA: Insufficient documentation

## 2015-05-12 DIAGNOSIS — N840 Polyp of corpus uteri: Secondary | ICD-10-CM | POA: Diagnosis not present

## 2015-05-12 DIAGNOSIS — N920 Excessive and frequent menstruation with regular cycle: Secondary | ICD-10-CM | POA: Insufficient documentation

## 2015-05-12 HISTORY — PX: DILATATION & CURETTAGE/HYSTEROSCOPY WITH MYOSURE: SHX6511

## 2015-05-12 LAB — CBC
HEMATOCRIT: 33.3 % — AB (ref 36.0–46.0)
HEMOGLOBIN: 10.9 g/dL — AB (ref 12.0–15.0)
MCH: 22.9 pg — AB (ref 26.0–34.0)
MCHC: 32.7 g/dL (ref 30.0–36.0)
MCV: 70.1 fL — AB (ref 78.0–100.0)
Platelets: 305 10*3/uL (ref 150–400)
RBC: 4.75 MIL/uL (ref 3.87–5.11)
RDW: 16.5 % — AB (ref 11.5–15.5)
WBC: 5 10*3/uL (ref 4.0–10.5)

## 2015-05-12 SURGERY — DILATATION & CURETTAGE/HYSTEROSCOPY WITH MYOSURE
Anesthesia: General

## 2015-05-12 MED ORDER — FENTANYL CITRATE (PF) 250 MCG/5ML IJ SOLN
INTRAMUSCULAR | Status: AC
  Filled 2015-05-12: qty 5

## 2015-05-12 MED ORDER — SCOPOLAMINE 1 MG/3DAYS TD PT72
1.0000 | MEDICATED_PATCH | Freq: Once | TRANSDERMAL | Status: DC
  Administered 2015-05-12: 1.5 mg via TRANSDERMAL

## 2015-05-12 MED ORDER — KETOROLAC TROMETHAMINE 30 MG/ML IJ SOLN
INTRAMUSCULAR | Status: DC | PRN
  Administered 2015-05-12: 30 mg via INTRAVENOUS

## 2015-05-12 MED ORDER — MEPERIDINE HCL 25 MG/ML IJ SOLN: 6.2500 mg | INTRAMUSCULAR | Status: DC | PRN

## 2015-05-12 MED ORDER — SODIUM CHLORIDE 0.9 % IR SOLN
Status: DC | PRN
  Administered 2015-05-12 (×2): 3000 mL

## 2015-05-12 MED ORDER — HYDROMORPHONE HCL 1 MG/ML IJ SOLN: 0.2500 mg | INTRAMUSCULAR | Status: DC | PRN

## 2015-05-12 MED ORDER — ONDANSETRON HCL 4 MG/2ML IJ SOLN: 4.0000 mg | Freq: Once | INTRAMUSCULAR | Status: DC | PRN

## 2015-05-12 MED ORDER — LACTATED RINGERS IV SOLN
INTRAVENOUS | Status: DC
  Administered 2015-05-12 (×2): via INTRAVENOUS

## 2015-05-12 MED ORDER — ROCURONIUM BROMIDE 100 MG/10ML IV SOLN
INTRAVENOUS | Status: AC
  Filled 2015-05-12: qty 1

## 2015-05-12 MED ORDER — DEXAMETHASONE SODIUM PHOSPHATE 4 MG/ML IJ SOLN
INTRAMUSCULAR | Status: AC
Start: 2015-05-12 — End: 2015-05-12
  Filled 2015-05-12: qty 1

## 2015-05-12 MED ORDER — LIDOCAINE HCL (CARDIAC) 20 MG/ML IV SOLN
INTRAVENOUS | Status: DC | PRN
  Administered 2015-05-12: 80 mg via INTRAVENOUS

## 2015-05-12 MED ORDER — SCOPOLAMINE 1 MG/3DAYS TD PT72
MEDICATED_PATCH | TRANSDERMAL | Status: AC
  Administered 2015-05-12: 1.5 mg via TRANSDERMAL
  Filled 2015-05-12: qty 1

## 2015-05-12 MED ORDER — IBUPROFEN 800 MG PO TABS: 800.0000 mg | ORAL_TABLET | Freq: Three times a day (TID) | ORAL | Status: AC | PRN

## 2015-05-12 MED ORDER — FENTANYL CITRATE (PF) 100 MCG/2ML IJ SOLN
INTRAMUSCULAR | Status: DC | PRN
  Administered 2015-05-12 (×3): 50 ug via INTRAVENOUS

## 2015-05-12 MED ORDER — LIDOCAINE HCL (CARDIAC) 20 MG/ML IV SOLN
INTRAVENOUS | Status: AC
  Filled 2015-05-12: qty 5

## 2015-05-12 MED ORDER — MIDAZOLAM HCL 2 MG/2ML IJ SOLN
INTRAMUSCULAR | Status: DC | PRN
  Administered 2015-05-12: 2 mg via INTRAVENOUS

## 2015-05-12 MED ORDER — PROPOFOL 10 MG/ML IV BOLUS
INTRAVENOUS | Status: AC
  Filled 2015-05-12: qty 20

## 2015-05-12 MED ORDER — ONDANSETRON HCL 4 MG/2ML IJ SOLN
INTRAMUSCULAR | Status: DC | PRN
Start: 2015-05-12 — End: 2015-05-12
  Administered 2015-05-12: 4 mg via INTRAVENOUS

## 2015-05-12 MED ORDER — DEXAMETHASONE SODIUM PHOSPHATE 4 MG/ML IJ SOLN
INTRAMUSCULAR | Status: DC | PRN
  Administered 2015-05-12: 4 mg via INTRAVENOUS

## 2015-05-12 MED ORDER — MIDAZOLAM HCL 2 MG/2ML IJ SOLN
INTRAMUSCULAR | Status: AC
  Filled 2015-05-12: qty 2

## 2015-05-12 MED ORDER — PROPOFOL 10 MG/ML IV BOLUS
INTRAVENOUS | Status: DC | PRN
  Administered 2015-05-12: 180 mg via INTRAVENOUS

## 2015-05-12 MED FILL — IBUPROFEN 800 MG TABLET: 800 | 10 days supply | Qty: 30 | Fill #0

## 2015-05-12 SURGICAL SUPPLY — 20 items
CANISTER SUCT 3000ML (MISCELLANEOUS) ×3 IMPLANT
CATH ROBINSON RED A/P 16FR (CATHETERS) ×3 IMPLANT
CLOTH BEACON ORANGE TIMEOUT ST (SAFETY) ×3 IMPLANT
CONTAINER PREFILL 10% NBF 60ML (FORM) ×6 IMPLANT
DEVICE MYOSURE LITE (MISCELLANEOUS) IMPLANT
DEVICE MYOSURE REACH (MISCELLANEOUS) ×3 IMPLANT
ELECT REM PT RETURN 9FT ADLT (ELECTROSURGICAL) ×3
ELECTRODE REM PT RTRN 9FT ADLT (ELECTROSURGICAL) ×1 IMPLANT
FILTER ARTHROSCOPY CONVERTOR (FILTER) ×3 IMPLANT
GLOVE BIOGEL PI IND STRL 7.0 (GLOVE) ×3 IMPLANT
GLOVE BIOGEL PI INDICATOR 7.0 (GLOVE) ×6
GLOVE ECLIPSE 6.5 STRL STRAW (GLOVE) ×3 IMPLANT
GOWN STRL REUS W/TWL LRG LVL3 (GOWN DISPOSABLE) ×6 IMPLANT
PACK VAGINAL MINOR WOMEN LF (CUSTOM PROCEDURE TRAY) ×3 IMPLANT
PAD OB MATERNITY 4.3X12.25 (PERSONAL CARE ITEMS) ×3 IMPLANT
SEAL ROD LENS SCOPE MYOSURE (ABLATOR) ×3 IMPLANT
TOWEL OR 17X24 6PK STRL BLUE (TOWEL DISPOSABLE) ×6 IMPLANT
TUBING AQUILEX INFLOW (TUBING) ×3 IMPLANT
TUBING AQUILEX OUTFLOW (TUBING) ×3 IMPLANT
WATER STERILE IRR 1000ML POUR (IV SOLUTION) ×3 IMPLANT

## 2015-05-12 NOTE — Anesthesia Procedure Notes (Signed)
Procedure Name: LMA Insertion Date/Time: 05/12/2015 12:10 PM Performed by: Elenore Paddy Pre-anesthesia Checklist: Patient identified, Emergency Drugs available, Suction available and Patient being monitored Patient Re-evaluated:Patient Re-evaluated prior to inductionOxygen Delivery Method: Circle system utilized Preoxygenation: Pre-oxygenation with 100% oxygen Intubation Type: IV induction LMA: LMA inserted LMA Size: 4.0 Tube type: Oral Number of attempts: 1 Placement Confirmation: ETT inserted through vocal cords under direct vision,  positive ETCO2 and breath sounds checked- equal and bilateral Dental Injury: Teeth and Oropharynx as per pre-operative assessment

## 2015-05-12 NOTE — Transfer of Care (Signed)
Immediate Anesthesia Transfer of Care Note  Patient: Hannah Woodward  Procedure(s) Performed: Procedure(s): DILATATION & CURETTAGE/HYSTEROSCOPY WITH MYOSURE (N/A)  Patient Location: PACU  Anesthesia Type:General  Level of Consciousness: awake, alert  and oriented  Airway & Oxygen Therapy: Patient Spontanous Breathing and Patient connected to nasal cannula oxygen  Post-op Assessment: Report given to RN and Post -op Vital signs reviewed and stable  Post vital signs: Reviewed and stable  Last Vitals: There were no vitals filed for this visit.  Complications: No apparent anesthesia complications

## 2015-05-12 NOTE — Anesthesia Preprocedure Evaluation (Signed)
Anesthesia Evaluation  Patient identified by MRN, date of birth, ID band Patient awake    Reviewed: Allergy & Precautions, NPO status , Patient's Chart, lab work & pertinent test results  History of Anesthesia Complications (+) AWARENESS UNDER ANESTHESIA  Airway Mallampati: I  TM Distance: >3 FB Neck ROM: Full    Dental   Pulmonary    Pulmonary exam normal        Cardiovascular Normal cardiovascular exam     Neuro/Psych    GI/Hepatic   Endo/Other    Renal/GU      Musculoskeletal   Abdominal   Peds  Hematology   Anesthesia Other Findings   Reproductive/Obstetrics                             Anesthesia Physical Anesthesia Plan  ASA: II  Anesthesia Plan: General   Post-op Pain Management:    Induction: Intravenous  Airway Management Planned: LMA  Additional Equipment:   Intra-op Plan:   Post-operative Plan: Extubation in OR  Informed Consent: I have reviewed the patients History and Physical, chart, labs and discussed the procedure including the risks, benefits and alternatives for the proposed anesthesia with the patient or authorized representative who has indicated his/her understanding and acceptance.     Plan Discussed with: CRNA and Surgeon  Anesthesia Plan Comments:         Anesthesia Quick Evaluation

## 2015-05-12 NOTE — Discharge Instructions (Signed)
CALL  IF TEMP>100.4, NOTHING PER VAGINA X 1WK, CALL IF SOAKING A MAXI  PAD EVERY HOUR OR MORE FREQUENTLY  Dilation and Curettage or Vacuum Curettage, Care After Please remove patch within 72hrs after discharge. Drink plenty of fluids over the next several days. Make sure you use the restroom within 6hrs of discharge. Do not take Motrin until after 6:30 pm today.  No follow up appt needed.   Refer to this sheet in the next few weeks. These instructions provide you with information on caring for yourself after your procedure. Your health care provider may also give you more specific instructions. Your treatment has been planned according to current medical practices, but problems sometimes occur. Call your health care provider if you have any problems or questions after your procedure. WHAT TO EXPECT AFTER THE PROCEDURE After your procedure, it is typical to have light cramping and bleeding. This may last for 2 days to 2 weeks after the procedure. HOME CARE INSTRUCTIONS   Do not drive for 24 hours.  Wait 1 week before returning to strenuous activities.  Take your temperature 2 times a day for 4 days and write it down. Provide these temperatures to your health care provider if you develop a fever.  Avoid long periods of standing.  Avoid heavy lifting, pushing, or pulling. Do not lift anything heavier than 10 pounds (4.5 kg).  Limit stair climbing to once or twice a day.  Take rest periods often.  You may resume your usual diet.  Drink enough fluids to keep your urine clear or pale yellow.  Your usual bowel function should return. If you have constipation, you may:  Take a mild laxative with permission from your health care provider.  Add fruit and bran to your diet.  Drink more fluids.  Take showers instead of baths until your health care provider gives you permission to take baths.  Do not go swimming or use a hot tub until your health care provider approves.  Try to have  someone with you or available to you the first 24-48 hours, especially if you were given a general anesthetic.  Do not douche, use tampons, or have sex (intercourse) for 2 weeks after the procedure.  Only take over-the-counter or prescription medicines as directed by your health care provider. Do not take aspirin. It can cause bleeding.  Follow up with your health care provider as directed. SEEK MEDICAL CARE IF:   You have increasing cramps or pain that is not relieved with medicine.  You have abdominal pain that does not seem to be related to the same area of earlier cramping and pain.  You have bad smelling vaginal discharge.  You have a rash.  You are having problems with any medicine. SEEK IMMEDIATE MEDICAL CARE IF:   You have bleeding that is heavier than a normal menstrual period.  You have a fever.  You have chest pain.  You have shortness of breath.  You feel dizzy or feel like fainting.  You pass out.  You have pain in your shoulder strap area.  You have heavy vaginal bleeding with or without blood clots. MAKE SURE YOU:   Understand these instructions.  Will watch your condition.  Will get help right away if you are not doing well or get worse.   This information is not intended to replace advice given to you by your health care provider. Make sure you discuss any questions you have with your health care provider.   Document Released:  01/23/2000 Document Revised: 01/30/2013 Document Reviewed: 08/24/2012 Elsevier Interactive Patient Education [employer].

## 2015-05-12 NOTE — Brief Op Note (Signed)
05/12/2015  12:52 PM  PATIENT:  Hannah Woodward  55 y.o. female  PRE-OPERATIVE DIAGNOSIS:  Complex Endometrial Hyperplasia, endometrial polyps, menorrhagia with regular cycles  POST-OPERATIVE DIAGNOSIS:  Complex Endometrial Hyperplasia, endometrial polyps, menorrhagia with regular cycles  PROCEDURE:  Diagnostic hysteroscopy, hysteroscopic resection of endometrial polyps using myosure, dilation and curettage  SURGEON:  Surgeon(s) and Role:    * Servando Salina, MD - Primary  PHYSICIAN ASSISTANT:   ASSISTANTS: none   ANESTHESIA:   general FINDINGS; multiple endometrial polyps, thickened endometrium,  EBL:  Total I/O In: 1100 [I.V.:1100] Out: 115 [Urine:100; Blood:15]  BLOOD ADMINISTERED:none  DRAINS: none   LOCAL MEDICATIONS USED:  NONE  SPECIMEN:  Source of Specimen:  emc, endometrial polyps  DISPOSITION OF SPECIMEN:  PATHOLOGY  COUNTS:  YES  TOURNIQUET:  * No tourniquets in log *  DICTATION: .Other Dictation: Dictation Number [id number]  PLAN OF CARE: Discharge to home after PACU  PATIENT DISPOSITION:  PACU - hemodynamically stable.   Delay start of Pharmacological VTE agent (>24hrs) due to surgical blood loss or risk of bleeding: no

## 2015-05-12 NOTE — Anesthesia Postprocedure Evaluation (Signed)
Anesthesia Post Note  Patient: Hannah Woodward  Procedure(s) Performed: Procedure(s) (LRB): DILATATION & CURETTAGE/HYSTEROSCOPY WITH MYOSURE (N/A)  Patient location during evaluation: PACU Anesthesia Type: General Level of consciousness: sedated Pain management: pain level controlled Vital Signs Assessment: post-procedure vital signs reviewed and stable Respiratory status: spontaneous breathing and respiratory function stable Cardiovascular status: stable Anesthetic complications: no    Last Vitals:  Filed Vitals:   05/12/15 1245 05/12/15 1300  BP: 127/72 130/75  Pulse: 79 73  Temp: 36.7 C   Resp: 17 19    Last Pain: There were no vitals filed for this visit.               Heide Brossart DANIEL

## 2015-05-13 ENCOUNTER — Encounter (HOSPITAL_COMMUNITY): Payer: Self-pay | Admitting: Obstetrics and Gynecology

## 2015-05-13 NOTE — Op Note (Signed)
Hannah Woodward, Hannah Woodward                 ACCOUNT NO.:  [id number]  MEDICAL RECORD NO.:  [id number]  LOCATION:  [facility]                          FACILITY:  [facility]  PHYSICIAN:  Servando Salina, M.D.DATE OF BIRTH:  October 07, 1960  DATE OF PROCEDURE:  05/12/2015 DATE OF DISCHARGE:  05/12/2015                              OPERATIVE REPORT   PREOPERATIVE DIAGNOSIS:  Complex endometrial hyperplasia, menorrhagia with regular cycle.  PROCEDURE:  Diagnostic hysteroscopy, hysteroscopic resection of  endometrial polyps using MyoSure, dilation and curettage.  POSTOPERATIVE DIAGNOSIS:  Complex endometrial hyperplasia, endometrial polyp, menorrhagia with regular cycles.  ANESTHESIA:  General.  SURGEON:  Servando Salina, MD  ASSISTANT:  None.  DESCRIPTION OF PROCEDURE:  Under adequate general anesthesia, the patient was placed in the dorsal lithotomy position.  She was sterilely prepped and draped in usual fashion.  The bladder was catheterized with small amount of urine.  Examination under anesthesia revealed an anteflexed uterus.  No adnexal masses could be appreciated.  A bivalve speculum was placed in the vagina.  Single-tooth tenaculum was placed on the anterior lip of the cervix.  The cervix was serially dilated up to a #21 Pratt dilator.  A MyoSure hysteroscope apparatus was inserted into the cavity without incident.  Multiple endometrial polyps and endometrial thickening was noted.  The left tubal ostia could be seen, the right was not seen.  The endocervical canal was without any lesions. The working apparatus of the MyoSure specifically the Reach was inserted. The endometrial polyps were all are resected as was the endometrial lining itself.  When all areas had been resected, the hysteroscope was removed. The cavity was then curetted.  All instruments were then removed from the vagina.  SPECIMEN LABELED:  Endometrial polyp and endometrial curettings were sent to Pathology.  ESTIMATED  BLOOD LOSS:  Less than 15 mL.  FLUID DEFICIT:  396.  COMPLICATION:  None.  The patient tolerated the procedure well, was transferred to recovery in stable condition.     Servando Salina, M.D.     [place] [facility]/MEDQ  D:  05/12/2015  T:  05/13/2015  Job:  [id number]

## 2015-06-27 DIAGNOSIS — L237 Allergic contact dermatitis due to plants, except food: Secondary | ICD-10-CM | POA: Diagnosis not present

## 2015-07-28 ENCOUNTER — Encounter: Payer: Self-pay | Admitting: Gynecologic Oncology

## 2015-07-28 ENCOUNTER — Ambulatory Visit: Payer: 59 | Attending: Gynecologic Oncology | Admitting: Gynecologic Oncology

## 2015-07-28 VITALS — BP 128/76 | HR 78 | Temp 98.2°F | Resp 18 | Ht 62.0 in | Wt 186.8 lb

## 2015-07-28 DIAGNOSIS — E669 Obesity, unspecified: Secondary | ICD-10-CM | POA: Insufficient documentation

## 2015-07-28 DIAGNOSIS — Z85820 Personal history of malignant melanoma of skin: Secondary | ICD-10-CM | POA: Diagnosis not present

## 2015-07-28 DIAGNOSIS — N8501 Benign endometrial hyperplasia: Secondary | ICD-10-CM | POA: Diagnosis not present

## 2015-07-28 DIAGNOSIS — K219 Gastro-esophageal reflux disease without esophagitis: Secondary | ICD-10-CM | POA: Diagnosis not present

## 2015-07-28 DIAGNOSIS — N920 Excessive and frequent menstruation with regular cycle: Secondary | ICD-10-CM | POA: Diagnosis not present

## 2015-07-28 DIAGNOSIS — Z6834 Body mass index (BMI) 34.0-34.9, adult: Secondary | ICD-10-CM | POA: Insufficient documentation

## 2015-07-28 DIAGNOSIS — Z888 Allergy status to other drugs, medicaments and biological substances status: Secondary | ICD-10-CM | POA: Diagnosis not present

## 2015-07-28 NOTE — Progress Notes (Signed)
Consult Note: Gyn-Onc  Consult was requested by Dr. Garwin Brothers for the evaluation of Hannah Woodward 55 y.o. female  CC:  Chief Complaint  Patient presents with  . Endometrial hyperplasia ,complex    New Consultation    Assessment/Plan:  Ms. Hannah Woodward  is a 55 y.o.  year old with CAH. I discussed with her the nature and etiology of CAH. I discussed the options for therapy including medical therapy with progestins and surgical intervention with hysterectomy, BSO, and possible staging. I discussed that there is a 40% risk for co-existing invasive carcinoma. There is a 30% chance for development of carcinoma from [facility].  I am recommending definitive therapy with hysterectomy, BSO and frozen section and SLN biopsy if invasive carcinoma is identified.  We discussed operative risks including  bleeding, infection, damage to internal organs (such as bladder,ureters, bowels), blood clot, reoperation and rehospitalization.   HPI: Hannah Woodward is a very pleasant 55 year old G4P2 who is seen in consultation at the request of Dr Garwin Brothers for [facility].  The patient reports a history of menorrhagia for many years. She has a history of a failed endometrial ablation several years ago. She continue to have ongoing vaginal bleeding that was refractory to all conservative measures and therefore was being prepared for a definitive hysterectomy. 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 To further workup this finding she underwent a D&C in the operating room which revealed CAH. Of note her Pap smear from 06/04/2014 revealed ASCUS that was negative for high-risk HPV subtypes.  The patient is otherwise fairly healthy. She is overweight with a BMI of 34 kg/m. She has a history of 2 prior cesarean sections.   Current Meds:  Outpatient Encounter Prescriptions as of 07/28/2015  Medication Sig  . ibuprofen  (ADVIL,MOTRIN) 800 MG tablet Take 1 tablet (800 mg total) by mouth every 8 (eight) hours as needed for moderate pain.   No facility-administered encounter medications on file as of 07/28/2015.    Allergy:  Allergies  Allergen Reactions  . Phenergan [Promethazine Hcl] Other (See Comments)    Patient did not want phenergan.  Patient states "no control over body movement.  Feels like out of body"    Social Hx:   Social History   Social History  . Marital Status: Divorced    Spouse Name: N/A  . Number of Children: N/A  . Years of Education: N/A   Occupational History  . Not on file.   Social History Main Topics  . Smoking status: Never Smoker   . Smokeless tobacco: Never Used  . Alcohol Use: Yes     Comment: beer- socially  . Drug Use: No  . Sexual Activity: Not Currently    Birth Control/ Protection: None   Other Topics Concern  . Not on file   Social History Narrative    Past Surgical Hx:  Past Surgical History  Procedure Laterality Date  . Cesarean section      x 2  . Esophagogastroduodenoscopy N/A 11/05/2014    Procedure: ESOPHAGOGASTRODUODENOSCOPY (EGD);  Surgeon: Teena Irani, MD;  Location: [facility] ENDOSCOPY;  Service: Endoscopy;  Laterality: N/A;  [place]  . Colonoscopy N/A 11/05/2014    Procedure: COLONOSCOPY;  Surgeon: Teena Irani, MD;  Location: [place];  Service: Endoscopy;  Laterality: N/A;  . Attempted endometrial ablation      [place]  . Dilatation & curettage/hysteroscopy with myosure N/A 05/12/2015  Procedure: [place];  Surgeon: Servando Salina, MD;  Location: [facility];  Service: Gynecology;  Laterality: N/A;    Past Medical Hx:  Past Medical History  Diagnosis Date  . GERD (gastroesophageal reflux disease)     diet controlled, no meds  . Skin cancer     right arm, left chin    Past Gynecological History:  Ablation for menorrhagia. ASCUS pap April, 2017 (HR HPV negative). Cesarean section x2.  No LMP  recorded.  Family Hx: History reviewed. No pertinent family history.  Review of Systems:  Constitutional  Feels well,    ENT Normal appearing ears and nares bilaterally Skin/Breast  No rash, sores, jaundice, itching, dryness Cardiovascular  No chest pain, shortness of breath, or edema  Pulmonary  No cough or wheeze.  Gastro Intestinal  No nausea, vomitting, or diarrhoea. No bright red blood per rectum, no abdominal pain, change in bowel movement, or constipation.  Genito Urinary  No frequency, urgency, dysuria, + menorrhagia Musculo Skeletal  No myalgia, arthralgia, joint swelling or pain  Neurologic  No weakness, numbness, change in gait,  Psychology  No depression, anxiety, insomnia.   Vitals:  Blood pressure 128/76, pulse 78, temperature 98.2 F (36.8 C), temperature source Oral, resp. rate 18, height 5\' 2"  (1.575 m), weight 186 lb 12.8 oz (84.732 kg), SpO2 100 %.  Physical Exam: WD in NAD Neck  Supple NROM, without any enlargements.  Lymph Node Survey No cervical supraclavicular or inguinal adenopathy Cardiovascular  Pulse normal rate, regularity and rhythm. S1 and S2 normal.  Lungs  Clear to auscultation bilateraly, without wheezes/crackles/rhonchi. Good air movement.  Skin  No rash/lesions/breakdown  Psychiatry  Alert and oriented to person, place, and time  Abdomen  Normoactive bowel sounds, abdomen soft, non-tender and obese without evidence of hernia.  Back No CVA tenderness Genito Urinary  Vulva/vagina: Normal external female genitalia.  No lesions. No discharge or bleeding.  Bladder/urethra:  No lesions or masses, well supported bladder  Vagina: normal  Cervix: Normal appearing, no lesions.  Uterus: slightly bulky mobile, no parametrial involvement or nodularity.  Adnexa: no palpable masses. Rectal  Good tone, no masses no cul de sac nodularity.  Extremities  No bilateral cyanosis, clubbing or edema.   Donaciano Eva, MD  07/28/2015, 5:25  PM

## 2015-07-28 NOTE — Patient Instructions (Signed)
Preparing for your Surgery  Plan for surgery on July 11 with Dr. Everitt Amber.  You will be scheduled for a robotic assisted total hysterectomy, bilateral salpingo-oophorectomy, possible sentinel lymph node biopsy.  Pre-operative Testing -You will receive a phone call from presurgical testing at [facility] to arrange for a pre-operative testing appointment before your surgery.  This appointment normally occurs one to two weeks before your scheduled surgery.   -Bring your insurance card, copy of an advanced directive if applicable, medication list  -At that visit, you will be asked to sign a consent for a possible blood transfusion in case a transfusion becomes necessary during surgery.  The need for a blood transfusion is rare but having consent is a necessary part of your care.     -You should not be taking blood thinners or aspirin at least ten days prior to surgery unless instructed by your surgeon.  Day Before Surgery at [place] will be asked to take in a light diet the day before surgery.  Avoid carbonated beverages.  You will be advised to have nothing to eat or drink after midnight the evening before.     Eat a light diet the day before surgery.  Examples including soups, broths, toast, yogurt, mashed potatoes.  Things to avoid include carbonated beverages (fizzy beverages), raw fruits and raw vegetables, or beans.    If your bowels are filled with gas, your surgeon will have difficulty visualizing your pelvic organs which increases your surgical risks.  Your role in recovery Your role is to become active as soon as directed by your doctor, while still giving yourself time to heal.  Rest when you feel tired. You will be asked to do the following in order to speed your recovery:  - Cough and breathe deeply. This helps toclear and expand your lungs and can prevent pneumonia. You may be given a spirometer to practice deep breathing. A staff member will show you how  to use the spirometer. - Do mild physical activity. Walking or moving your legs help your circulation and body functions return to normal. A staff member will help you when you try to walk and will provide you with simple exercises. Do not try to get up or walk alone the first time. - Actively manage your pain. Managing your pain lets you move in comfort. We will ask you to rate your pain on a scale of zero to 10. It is your responsibility to tell your doctor or nurse where and how much you hurt so your pain can be treated.  Special Considerations -If you are diabetic, you may be placed on insulin after surgery to have closer control over your blood sugars to promote healing and recovery.  This does not mean that you will be discharged on insulin.  If applicable, your oral antidiabetics will be resumed when you are tolerating a solid diet.  -Your final pathology results from surgery should be available by the Friday after surgery and the results will be relayed to you when available.  Blood Transfusion Information WHAT IS A BLOOD TRANSFUSION? A transfusion is the replacement of blood or some of its parts. Blood is made up of multiple cells which provide different functions.  Red blood cells carry oxygen and are used for blood loss replacement.  White blood cells fight against infection.  Platelets control bleeding.  Plasma helps clot blood.  Other blood products are available for specialized needs, such as hemophilia or other clotting disorders. BEFORE  THE TRANSFUSION  Who gives blood for transfusions?   You may be able to donate blood to be used at a later date on yourself (autologous donation).  Relatives can be asked to donate blood. This is generally not any safer than if you have received blood from a stranger. The same precautions are taken to ensure safety when a relative's blood is donated.  Healthy volunteers who are fully evaluated to make sure their blood is safe. This is  blood bank blood. Transfusion therapy is the safest it has ever been in the practice of medicine. Before blood is taken from a donor, a complete history is taken to make sure that person has no history of diseases nor engages in risky social behavior (examples are intravenous drug use or sexual activity with multiple partners). The donor's travel history is screened to minimize risk of transmitting infections, such as malaria. The donated blood is tested for signs of infectious diseases, such as HIV and hepatitis. The blood is then tested to be sure it is compatible with you in order to minimize the chance of a transfusion reaction. If you or a relative donates blood, this is often done in anticipation of surgery and is not appropriate for emergency situations. It takes many days to process the donated blood. RISKS AND COMPLICATIONS Although transfusion therapy is very safe and saves many lives, the main dangers of transfusion include:   Getting an infectious disease.  Developing a transfusion reaction. This is an allergic reaction to something in the blood you were given. Every precaution is taken to prevent this. The decision to have a blood transfusion has been considered carefully by your caregiver before blood is given. Blood is not given unless the benefits outweigh the risks.

## 2015-08-08 DIAGNOSIS — Z1231 Encounter for screening mammogram for malignant neoplasm of breast: Secondary | ICD-10-CM | POA: Diagnosis not present

## 2015-08-22 ENCOUNTER — Encounter (HOSPITAL_COMMUNITY)
Admission: RE | Admit: 2015-08-22 | Discharge: 2015-08-22 | Disposition: A | Discharge status: Home / Self Care | Payer: 59 | Source: Ambulatory Visit | Attending: Gynecologic Oncology | Admitting: Gynecologic Oncology

## 2015-08-22 ENCOUNTER — Encounter (HOSPITAL_COMMUNITY): Payer: Self-pay

## 2015-08-22 DIAGNOSIS — Z01812 Encounter for preprocedural laboratory examination: Secondary | ICD-10-CM | POA: Insufficient documentation

## 2015-08-22 HISTORY — DX: Endometrial hyperplasia, unspecified: N85.00

## 2015-08-22 LAB — URINALYSIS, ROUTINE W REFLEX MICROSCOPIC
BILIRUBIN URINE: NEGATIVE
Glucose, UA: NEGATIVE mg/dL
Ketones, ur: NEGATIVE mg/dL
Leukocytes, UA: NEGATIVE
Nitrite: NEGATIVE
PROTEIN: NEGATIVE mg/dL
Specific Gravity, Urine: 1.018 (ref 1.005–1.030)
pH: 6 (ref 5.0–8.0)

## 2015-08-22 LAB — COMPREHENSIVE METABOLIC PANEL
ALT: 13 U/L — AB (ref 14–54)
ANION GAP: 9 (ref 5–15)
AST: 19 U/L (ref 15–41)
Albumin: 4.1 g/dL (ref 3.5–5.0)
Alkaline Phosphatase: 72 U/L (ref 38–126)
BUN: 9 mg/dL (ref 6–20)
CHLORIDE: 105 mmol/L (ref 101–111)
CO2: 23 mmol/L (ref 22–32)
Calcium: 9.4 mg/dL (ref 8.9–10.3)
Creatinine, Ser: 0.82 mg/dL (ref 0.44–1.00)
Glucose, Bld: 122 mg/dL — ABNORMAL HIGH (ref 65–99)
POTASSIUM: 3.6 mmol/L (ref 3.5–5.1)
SODIUM: 137 mmol/L (ref 135–145)
Total Bilirubin: 0.5 mg/dL (ref 0.3–1.2)
Total Protein: 7.6 g/dL (ref 6.5–8.1)

## 2015-08-22 LAB — CBC WITH DIFFERENTIAL/PLATELET
Basophils Absolute: 0 10*3/uL (ref 0.0–0.1)
Basophils Relative: 1 %
Eosinophils Absolute: 0.2 10*3/uL (ref 0.0–0.7)
Eosinophils Relative: 4 %
HEMATOCRIT: 37.4 % (ref 36.0–46.0)
HEMOGLOBIN: 12.2 g/dL (ref 12.0–15.0)
LYMPHS ABS: 2 10*3/uL (ref 0.7–4.0)
LYMPHS PCT: 36 %
MCH: 24.3 pg — AB (ref 26.0–34.0)
MCHC: 32.6 g/dL (ref 30.0–36.0)
MCV: 74.5 fL — ABNORMAL LOW (ref 78.0–100.0)
MONOS PCT: 10 %
Monocytes Absolute: 0.6 10*3/uL (ref 0.1–1.0)
NEUTROS ABS: 2.7 10*3/uL (ref 1.7–7.7)
NEUTROS PCT: 49 %
Platelets: 283 10*3/uL (ref 150–400)
RBC: 5.02 MIL/uL (ref 3.87–5.11)
RDW: 17.8 % — ABNORMAL HIGH (ref 11.5–15.5)
WBC: 5.6 10*3/uL (ref 4.0–10.5)

## 2015-08-22 LAB — HCG, SERUM, QUALITATIVE: PREG SERUM: NEGATIVE

## 2015-08-22 LAB — URINE MICROSCOPIC-ADD ON

## 2015-08-22 LAB — ABO/RH: ABO/RH(D): B POS

## 2015-08-22 NOTE — Patient Instructions (Addendum)
Hannah Woodward  08/22/2015   Your procedure is scheduled on: 08/26/15  Report to [facility] Main  Entrance take [facility]  elevators to 3rd floor to  [place] at 5:45 AM.  Call this number if you have problems the morning of surgery [phone]   Remember: ONLY 1 PERSON MAY GO WITH YOU TO SHORT STAY TO GET  READY MORNING OF [place].  Do not eat food or drink liquids :After Midnight.     Take these medicines the morning of surgery with A SIP OF WATER: none                                You may not have any metal on your body including hair pins and              piercings  Do not wear jewelry, make-up, lotions, powders or perfumes, deodorant             Do not wear nail polish.  Do not shave  48 hours prior to surgery.              Men may shave face and neck.   Do not bring valuables to the hospital. [place].  Contacts, dentures or bridgework may not be worn into surgery.  Leave suitcase in the car. After surgery it may be brought to your room.  Practice deep breathing and coughing.              Please read over the following fact sheets you were given: _____________________________________________________________________             [facility] - Preparing for Surgery Before surgery, you can play an important role.  Because skin is not sterile, your skin needs to be as free of germs as possible.  You can reduce the number of germs on your skin by washing with CHG (chlorahexidine gluconate) soap before surgery.  CHG is an antiseptic cleaner which kills germs and bonds with the skin to continue killing germs even after washing. Please DO NOT use if you have an allergy to CHG or antibacterial soaps.  If your skin becomes reddened/irritated stop using the CHG and inform your nurse when you arrive at Short Stay. Do not shave (including legs and underarms) for at least 48 hours prior to the first CHG  shower.  You may shave your face/neck. Please follow these instructions carefully:  1.  Shower with CHG Soap the night before surgery and the  morning of Surgery.  2.  If you choose to wash your hair, wash your hair first as usual with your  normal  shampoo.  3.  After you shampoo, rinse your hair and body thoroughly to remove the  shampoo.                           4.  Use CHG as you would any other liquid soap.  You can apply chg directly  to the skin and wash                       Gently with a scrungie or clean washcloth.  5.  Apply the  CHG Soap to your body ONLY FROM THE NECK DOWN.   Do not use on face/ open                           Wound or open sores. Avoid contact with eyes, ears mouth and genitals (private parts).                       Wash face,  Genitals (private parts) with your normal soap.             6.  Wash thoroughly, paying special attention to the area where your surgery  will be performed.  7.  Thoroughly rinse your body with warm water from the neck down.  8.  DO NOT shower/wash with your normal soap after using and rinsing off  the CHG Soap.                9.  Pat yourself dry with a clean towel.            10.  Wear clean pajamas.            11.  Place clean sheets on your bed the night of your first shower and do not  sleep with pets. Day of Surgery : Do not apply any lotions/deodorants the morning of surgery.  Please wear clean clothes to the hospital/surgery center.  FAILURE TO FOLLOW THESE INSTRUCTIONS MAY RESULT IN THE CANCELLATION OF YOUR SURGERY PATIENT SIGNATURE_________________________________  NURSE SIGNATURE__________________________________  ________________________________________________________________________   Adam Phenix  An incentive spirometer is a tool that can help keep your lungs clear and active. This tool measures how well you are filling your lungs with each breath. Taking long deep breaths may help reverse or decrease the chance  of developing breathing (pulmonary) problems (especially infection) following:  A long period of time when you are unable to move or be active. BEFORE THE PROCEDURE   If the spirometer includes an indicator to show your best effort, your nurse or respiratory therapist will set it to a desired goal.  If possible, sit up straight or lean slightly forward. Try not to slouch.  Hold the incentive spirometer in an upright position. INSTRUCTIONS FOR USE   Sit on the edge of your bed if possible, or sit up as far as you can in bed or on a chair.  Hold the incentive spirometer in an upright position.  Breathe out normally.  Place the mouthpiece in your mouth and seal your lips tightly around it.  Breathe in slowly and as deeply as possible, raising the piston or the ball toward the top of the column.  Hold your breath for 3-5 seconds or for as long as possible. Allow the piston or ball to fall to the bottom of the column.  Remove the mouthpiece from your mouth and breathe out normally.  Rest for a few seconds and repeat Steps 1 through 7 at least 10 times every 1-2 hours when you are awake. Take your time and take a few normal breaths between deep breaths.  The spirometer may include an indicator to show your best effort. Use the indicator as a goal to work toward during each repetition.  After each set of 10 deep breaths, practice coughing to be sure your lungs are clear. If you have an incision (the cut made at the time of surgery), support your incision when coughing by placing a pillow or rolled up  towels firmly against it. Once you are able to get out of bed, walk around indoors and cough well. You may stop using the incentive spirometer when instructed by your caregiver.  RISKS AND COMPLICATIONS  Take your time so you do not get dizzy or light-headed.  If you are in pain, you may need to take or ask for pain medication before doing incentive spirometry. It is harder to take a deep  breath if you are having pain. AFTER USE  Rest and breathe slowly and easily.  It can be helpful to keep track of a log of your progress. Your caregiver can provide you with a simple table to help with this. If you are using the spirometer at home, follow these instructions: [place] IF:   You are having difficultly using the spirometer.  You have trouble using the spirometer as often as instructed.  Your pain medication is not giving enough relief while using the spirometer.  You develop fever of 100.5 F (38.1 C) or higher. SEEK IMMEDIATE MEDICAL CARE IF:   You cough up bloody sputum that had not been present before.  You develop fever of 102 F (38.9 C) or greater.  You develop worsening pain at or near the incision site. MAKE SURE YOU:   Understand these instructions.  Will watch your condition.  Will get help right away if you are not doing well or get worse. Document Released: 06/07/2006 Document Revised: 04/19/2011 Document Reviewed: 08/08/2006 ExitCare Patient Information 2014 ExitCare, [place].   ________________________________________________________________________  WHAT IS A BLOOD TRANSFUSION? Blood Transfusion Information  A transfusion is the replacement of blood or some of its parts. Blood is made up of multiple cells which provide different functions.  Red blood cells carry oxygen and are used for blood loss replacement.  White blood cells fight against infection.  Platelets control bleeding.  Plasma helps clot blood.  Other blood products are available for specialized needs, such as hemophilia or other clotting disorders. BEFORE THE TRANSFUSION  Who gives blood for transfusions?   Healthy volunteers who are fully evaluated to make sure their blood is safe. This is blood bank blood. Transfusion therapy is the safest it has ever been in the practice of medicine. Before blood is taken from a donor, a complete history is taken to make sure  that person has no history of diseases nor engages in risky social behavior (examples are intravenous drug use or sexual activity with multiple partners). The donor's travel history is screened to minimize risk of transmitting infections, such as malaria. The donated blood is tested for signs of infectious diseases, such as HIV and hepatitis. The blood is then tested to be sure it is compatible with you in order to minimize the chance of a transfusion reaction. If you or a relative donates blood, this is often done in anticipation of surgery and is not appropriate for emergency situations. It takes many days to process the donated blood. RISKS AND COMPLICATIONS Although transfusion therapy is very safe and saves many lives, the main dangers of transfusion include:   Getting an infectious disease.  Developing a transfusion reaction. This is an allergic reaction to something in the blood you were given. Every precaution is taken to prevent this. The decision to have a blood transfusion has been considered carefully by your caregiver before blood is given. Blood is not given unless the benefits outweigh the risks. AFTER THE TRANSFUSION  Right after receiving a blood transfusion, you will usually feel much  better and more energetic. This is especially true if your red blood cells have gotten low (anemic). The transfusion raises the level of the red blood cells which carry oxygen, and this usually causes an energy increase.  The nurse administering the transfusion will monitor you carefully for complications. HOME CARE INSTRUCTIONS  No special instructions are needed after a transfusion. You may find your energy is better. Speak with your caregiver about any limitations on activity for underlying diseases you may have. SEEK MEDICAL CARE IF:   Your condition is not improving after your transfusion.  You develop redness or irritation at the intravenous (IV) site. SEEK IMMEDIATE MEDICAL CARE IF:  Any of  the following symptoms occur over the next 12 hours:  Shaking chills.  You have a temperature by mouth above 102 F (38.9 C), not controlled by medicine.  Chest, back, or muscle pain.  People around you feel you are not acting correctly or are confused.  Shortness of breath or difficulty breathing.  Dizziness and fainting.  You get a rash or develop hives.  You have a decrease in urine output.  Your urine turns a dark color or changes to pink, red, or brown. Any of the following symptoms occur over the next 10 days:  You have a temperature by mouth above 102 F (38.9 C), not controlled by medicine.  Shortness of breath.  Weakness after normal activity.  The white part of the eye turns yellow (jaundice).  You have a decrease in the amount of urine or are urinating less often.  Your urine turns a dark color or changes to pink, red, or brown. Document Released: 01/23/2000 Document Revised: 04/19/2011 Document Reviewed: 09/11/2007 [facility] Patient Information 2014 [place], [place].  _______________________________________________________________________

## 2015-08-26 ENCOUNTER — Encounter (HOSPITAL_COMMUNITY): Payer: Self-pay | Admitting: Certified Registered Nurse Anesthetist

## 2015-08-26 ENCOUNTER — Ambulatory Visit (HOSPITAL_COMMUNITY): Payer: 59 | Admitting: Certified Registered Nurse Anesthetist

## 2015-08-26 ENCOUNTER — Encounter (HOSPITAL_COMMUNITY)
Admission: RE | Disposition: A | Discharge status: Home / Self Care | Payer: Self-pay | Source: Ambulatory Visit | Attending: Gynecologic Oncology

## 2015-08-26 ENCOUNTER — Ambulatory Visit (HOSPITAL_COMMUNITY)
Admission: RE | Admit: 2015-08-26 | Discharge: 2015-08-27 | Disposition: A | Discharge status: Home / Self Care | Payer: 59 | Source: Ambulatory Visit | Attending: Gynecologic Oncology | Admitting: Gynecologic Oncology

## 2015-08-26 DIAGNOSIS — D259 Leiomyoma of uterus, unspecified: Secondary | ICD-10-CM | POA: Insufficient documentation

## 2015-08-26 DIAGNOSIS — D251 Intramural leiomyoma of uterus: Secondary | ICD-10-CM | POA: Diagnosis not present

## 2015-08-26 DIAGNOSIS — N8311 Corpus luteum cyst of right ovary: Secondary | ICD-10-CM | POA: Diagnosis not present

## 2015-08-26 DIAGNOSIS — Z6833 Body mass index (BMI) 33.0-33.9, adult: Secondary | ICD-10-CM | POA: Insufficient documentation

## 2015-08-26 DIAGNOSIS — N879 Dysplasia of cervix uteri, unspecified: Secondary | ICD-10-CM | POA: Insufficient documentation

## 2015-08-26 DIAGNOSIS — T884XXA Failed or difficult intubation, initial encounter: Secondary | ICD-10-CM | POA: Diagnosis present

## 2015-08-26 DIAGNOSIS — N8312 Corpus luteum cyst of left ovary: Secondary | ICD-10-CM | POA: Diagnosis not present

## 2015-08-26 DIAGNOSIS — N736 Female pelvic peritoneal adhesions (postinfective): Secondary | ICD-10-CM | POA: Diagnosis not present

## 2015-08-26 DIAGNOSIS — E669 Obesity, unspecified: Secondary | ICD-10-CM | POA: Diagnosis not present

## 2015-08-26 DIAGNOSIS — Z85828 Personal history of other malignant neoplasm of skin: Secondary | ICD-10-CM | POA: Diagnosis not present

## 2015-08-26 DIAGNOSIS — N8502 Endometrial intraepithelial neoplasia [EIN]: Secondary | ICD-10-CM

## 2015-08-26 DIAGNOSIS — N8500 Endometrial hyperplasia, unspecified: Secondary | ICD-10-CM

## 2015-08-26 HISTORY — DX: Failed or difficult intubation, initial encounter: T88.4XXA

## 2015-08-26 HISTORY — PX: ROBOTIC ASSISTED TOTAL HYSTERECTOMY WITH BILATERAL SALPINGO OOPHERECTOMY: SHX6086

## 2015-08-26 LAB — TYPE AND SCREEN
ABO/RH(D): B POS
Antibody Screen: NEGATIVE

## 2015-08-26 SURGERY — HYSTERECTOMY, TOTAL, ROBOT-ASSISTED, LAPAROSCOPIC, WITH BILATERAL SALPINGO-OOPHORECTOMY
Anesthesia: General

## 2015-08-26 MED ORDER — FENTANYL CITRATE (PF) 100 MCG/2ML IJ SOLN
INTRAMUSCULAR | Status: DC | PRN
  Administered 2015-08-26 (×5): 50 ug via INTRAVENOUS

## 2015-08-26 MED ORDER — LIDOCAINE HCL (CARDIAC) 20 MG/ML IV SOLN
INTRAVENOUS | Status: DC | PRN
  Administered 2015-08-26: 100 mg via INTRAVENOUS

## 2015-08-26 MED ORDER — HYDROMORPHONE HCL 2 MG/ML IJ SOLN
INTRAMUSCULAR | Status: AC
  Filled 2015-08-26: qty 1

## 2015-08-26 MED ORDER — ROCURONIUM BROMIDE 100 MG/10ML IV SOLN
INTRAVENOUS | Status: DC | PRN
  Administered 2015-08-26 (×2): 10 mg via INTRAVENOUS
  Administered 2015-08-26: 50 mg via INTRAVENOUS

## 2015-08-26 MED ORDER — LACTATED RINGERS IV SOLN
INTRAVENOUS | Status: DC | PRN
  Administered 2015-08-26: 07:00:00 via INTRAVENOUS

## 2015-08-26 MED ORDER — STERILE WATER FOR IRRIGATION IR SOLN
Status: DC | PRN
  Administered 2015-08-26: 1000 mL

## 2015-08-26 MED ORDER — ROCURONIUM BROMIDE 100 MG/10ML IV SOLN
INTRAVENOUS | Status: AC
  Filled 2015-08-26: qty 1

## 2015-08-26 MED ORDER — ONDANSETRON HCL 4 MG/2ML IJ SOLN: 4.0000 mg | Freq: Once | INTRAMUSCULAR | Status: DC | PRN

## 2015-08-26 MED ORDER — OXYCODONE-ACETAMINOPHEN 5-325 MG PO TABS
1.0000 | ORAL_TABLET | ORAL | Status: DC | PRN
  Administered 2015-08-26 (×2): 1 via ORAL
  Filled 2015-08-26 (×3): qty 1

## 2015-08-26 MED ORDER — ONDANSETRON HCL 4 MG/2ML IJ SOLN
INTRAMUSCULAR | Status: AC
  Filled 2015-08-26: qty 2

## 2015-08-26 MED ORDER — FENTANYL CITRATE (PF) 100 MCG/2ML IJ SOLN: 25.0000 ug | INTRAMUSCULAR | Status: DC | PRN

## 2015-08-26 MED ORDER — KCL IN DEXTROSE-NACL 20-5-0.45 MEQ/L-%-% IV SOLN
INTRAVENOUS | Status: DC
Start: 2015-08-26 — End: 2015-08-27
  Administered 2015-08-26: 50 mL/h via INTRAVENOUS
  Administered 2015-08-27: 08:00:00 via INTRAVENOUS
  Filled 2015-08-26 (×2): qty 1000

## 2015-08-26 MED ORDER — PROPOFOL 10 MG/ML IV BOLUS
INTRAVENOUS | Status: DC | PRN
  Administered 2015-08-26: 200 mg via INTRAVENOUS
  Administered 2015-08-26: 100 mg via INTRAVENOUS

## 2015-08-26 MED ORDER — ONDANSETRON HCL 4 MG PO TABS
4.0000 mg | ORAL_TABLET | Freq: Four times a day (QID) | ORAL | Status: DC | PRN
Start: 2015-08-26 — End: 2015-08-27

## 2015-08-26 MED ORDER — MIDAZOLAM HCL 5 MG/5ML IJ SOLN
INTRAMUSCULAR | Status: DC | PRN
  Administered 2015-08-26: 2 mg via INTRAVENOUS

## 2015-08-26 MED ORDER — LACTATED RINGERS IV SOLN
INTRAVENOUS | Status: DC | PRN
  Administered 2015-08-26: 1000 mL

## 2015-08-26 MED ORDER — STERILE WATER FOR INJECTION IJ SOLN
INTRAMUSCULAR | Status: DC | PRN
  Administered 2015-08-26: 5 mL

## 2015-08-26 MED ORDER — ONDANSETRON HCL 4 MG/2ML IJ SOLN
INTRAMUSCULAR | Status: DC | PRN
  Administered 2015-08-26: 4 mg via INTRAVENOUS

## 2015-08-26 MED ORDER — LIDOCAINE HCL (CARDIAC) 20 MG/ML IV SOLN
INTRAVENOUS | Status: AC
  Filled 2015-08-26: qty 5

## 2015-08-26 MED ORDER — LACTATED RINGERS IV SOLN
INTRAVENOUS | Status: DC | PRN
  Administered 2015-08-26 (×2): via INTRAVENOUS

## 2015-08-26 MED ORDER — HYDROMORPHONE HCL 1 MG/ML IJ SOLN
INTRAMUSCULAR | Status: DC | PRN
  Administered 2015-08-26 (×3): 0.5 mg via INTRAVENOUS

## 2015-08-26 MED ORDER — SUGAMMADEX SODIUM 200 MG/2ML IV SOLN
INTRAVENOUS | Status: DC | PRN
  Administered 2015-08-26: 200 mg via INTRAVENOUS

## 2015-08-26 MED ORDER — CEFAZOLIN SODIUM-DEXTROSE 2-4 GM/100ML-% IV SOLN
2.0000 g | INTRAVENOUS | Status: AC
  Administered 2015-08-26: 2 g via INTRAVENOUS

## 2015-08-26 MED ORDER — ENOXAPARIN SODIUM 40 MG/0.4ML ~~LOC~~ SOLN
40.0000 mg | SUBCUTANEOUS | Status: AC
  Administered 2015-08-26: 40 mg via SUBCUTANEOUS
  Filled 2015-08-26: qty 0.4

## 2015-08-26 MED ORDER — DEXAMETHASONE SODIUM PHOSPHATE 10 MG/ML IJ SOLN
INTRAMUSCULAR | Status: DC | PRN
  Administered 2015-08-26: 10 mg via INTRAVENOUS

## 2015-08-26 MED ORDER — ONDANSETRON HCL 4 MG/2ML IJ SOLN
4.0000 mg | Freq: Four times a day (QID) | INTRAMUSCULAR | Status: DC | PRN
Start: 2015-08-26 — End: 2015-08-27
  Administered 2015-08-26: 4 mg via INTRAVENOUS
  Filled 2015-08-26: qty 2

## 2015-08-26 MED ORDER — PROPOFOL 10 MG/ML IV BOLUS
INTRAVENOUS | Status: AC
  Filled 2015-08-26: qty 20

## 2015-08-26 MED ORDER — LACTATED RINGERS IV SOLN: INTRAVENOUS | Status: DC

## 2015-08-26 MED ORDER — DICLOFENAC SODIUM 50 MG PO TBEC
50.0000 mg | DELAYED_RELEASE_TABLET | Freq: Three times a day (TID) | ORAL | Status: DC
  Administered 2015-08-26 – 2015-08-27 (×2): 50 mg via ORAL
  Filled 2015-08-26 (×5): qty 1

## 2015-08-26 MED ORDER — ENOXAPARIN SODIUM 40 MG/0.4ML ~~LOC~~ SOLN
40.0000 mg | SUBCUTANEOUS | Status: DC
  Administered 2015-08-27: 40 mg via SUBCUTANEOUS
  Filled 2015-08-26: qty 0.4

## 2015-08-26 MED ORDER — CEFAZOLIN SODIUM-DEXTROSE 2-4 GM/100ML-% IV SOLN
INTRAVENOUS | Status: AC
  Filled 2015-08-26: qty 100

## 2015-08-26 MED ORDER — SUGAMMADEX SODIUM 200 MG/2ML IV SOLN
INTRAVENOUS | Status: AC
  Filled 2015-08-26: qty 2

## 2015-08-26 MED ORDER — HYDROMORPHONE HCL 1 MG/ML IJ SOLN: 0.2000 mg | INTRAMUSCULAR | Status: DC | PRN

## 2015-08-26 MED ORDER — ARTIFICIAL TEARS OP OINT
TOPICAL_OINTMENT | OPHTHALMIC | Status: AC
  Filled 2015-08-26: qty 7

## 2015-08-26 MED ORDER — MIDAZOLAM HCL 2 MG/2ML IJ SOLN
INTRAMUSCULAR | Status: AC
  Filled 2015-08-26: qty 2

## 2015-08-26 MED ORDER — SUCCINYLCHOLINE CHLORIDE 20 MG/ML IJ SOLN
INTRAMUSCULAR | Status: DC | PRN
  Administered 2015-08-26: 60 mg via INTRAVENOUS
  Administered 2015-08-26: 100 mg via INTRAVENOUS

## 2015-08-26 MED ORDER — GABAPENTIN 300 MG PO CAPS
600.0000 mg | ORAL_CAPSULE | Freq: Every day | ORAL | Status: AC
  Administered 2015-08-26: 600 mg via ORAL
  Filled 2015-08-26 (×2): qty 2

## 2015-08-26 MED ORDER — STERILE WATER FOR INJECTION IJ SOLN
INTRAMUSCULAR | Status: AC
  Filled 2015-08-26: qty 10

## 2015-08-26 MED ORDER — FENTANYL CITRATE (PF) 250 MCG/5ML IJ SOLN
INTRAMUSCULAR | Status: AC
  Filled 2015-08-26: qty 5

## 2015-08-26 MED ORDER — ARTIFICIAL TEARS OP OINT
TOPICAL_OINTMENT | OPHTHALMIC | Status: DC | PRN
  Administered 2015-08-26: 1 via OPHTHALMIC

## 2015-08-26 SURGICAL SUPPLY — 59 items
APPLICATOR SURGIFLO ENDO (HEMOSTASIS) IMPLANT
BAG LAPAROSCOPIC 12 15 PORT 16 (BASKET) IMPLANT
BAG RETRIEVAL 12/15 (BASKET)
BAG RETRIEVAL 12/15MM (BASKET)
CHLORAPREP W/TINT 26ML (MISCELLANEOUS) ×4 IMPLANT
COVER SURGICAL LIGHT HANDLE (MISCELLANEOUS) ×4 IMPLANT
COVER TIP SHEARS 8 DVNC (MISCELLANEOUS) ×2 IMPLANT
COVER TIP SHEARS 8MM DA VINCI (MISCELLANEOUS) ×2
DRAPE ARM DVNC X/XI (DISPOSABLE) ×8 IMPLANT
DRAPE COLUMN DVNC XI (DISPOSABLE) ×2 IMPLANT
DRAPE DA VINCI XI ARM (DISPOSABLE) ×8
DRAPE DA VINCI XI COLUMN (DISPOSABLE) ×2
DRAPE SHEET LG 3/4 BI-LAMINATE (DRAPES) ×8 IMPLANT
DRAPE SURG IRRIG POUCH 19X23 (DRAPES) ×4 IMPLANT
ELECT REM PT RETURN 15FT ADLT (MISCELLANEOUS) ×4 IMPLANT
GLOVE BIO SURGEON STRL SZ 6 (GLOVE) ×16 IMPLANT
GLOVE BIO SURGEON STRL SZ 6.5 (GLOVE) ×6 IMPLANT
GLOVE BIO SURGEONS STRL SZ 6.5 (GLOVE) ×2
GOWN STRL REUS W/ TWL LRG LVL3 (GOWN DISPOSABLE) ×4 IMPLANT
GOWN STRL REUS W/TWL LRG LVL3 (GOWN DISPOSABLE) ×4
HOLDER FOLEY CATH W/STRAP (MISCELLANEOUS) ×4 IMPLANT
KIT BASIN OR (CUSTOM PROCEDURE TRAY) ×4 IMPLANT
KIT PROCEDURE DA VINCI SI (MISCELLANEOUS) ×2
KIT PROCEDURE DVNC SI (MISCELLANEOUS) ×2 IMPLANT
LIQUID BAND (GAUZE/BANDAGES/DRESSINGS) ×4 IMPLANT
MANIPULATOR UTERINE 4.5 ZUMI (MISCELLANEOUS) ×4 IMPLANT
MARKER SKIN DUAL TIP RULER LAB (MISCELLANEOUS) ×4 IMPLANT
NDL SAFETY ECLIPSE 18X1.5 (NEEDLE) ×2 IMPLANT
NEEDLE HYPO 18GX1.5 SHARP (NEEDLE) ×2
NEEDLE SPNL 18GX3.5 QUINCKE PK (NEEDLE) ×4 IMPLANT
OBTURATOR XI 8MM BLADELESS (TROCAR) ×4 IMPLANT
OCCLUDER COLPOPNEUMO (BALLOONS) ×4 IMPLANT
PAD POSITIONING PINK XL (MISCELLANEOUS) ×4 IMPLANT
PORT ACCESS TROCAR AIRSEAL 12 (TROCAR) ×2 IMPLANT
PORT ACCESS TROCAR AIRSEAL 5M (TROCAR) ×2
POUCH SPECIMEN RETRIEVAL 10MM (ENDOMECHANICALS) IMPLANT
SEAL CANN UNIV 5-8 DVNC XI (MISCELLANEOUS) ×8 IMPLANT
SEAL XI 5MM-8MM UNIVERSAL (MISCELLANEOUS) ×8
SET TRI-LUMEN FLTR TB AIRSEAL (TUBING) ×4 IMPLANT
SET TUBE IRRIG SUCTION NO TIP (IRRIGATION / IRRIGATOR) ×4 IMPLANT
SHEET LAVH (DRAPES) ×4 IMPLANT
SOLUTION ELECTROLUBE (MISCELLANEOUS) ×4 IMPLANT
SURGIFLO W/THROMBIN 8M KIT (HEMOSTASIS) IMPLANT
SUT MNCRL AB 4-0 PS2 18 (SUTURE) ×8 IMPLANT
SUT VIC AB 0 CT1 27 (SUTURE) ×6
SUT VIC AB 0 CT1 27XBRD ANTBC (SUTURE) ×6 IMPLANT
SUT VIC AB 3-0 SH 27 (SUTURE) ×4
SUT VIC AB 3-0 SH 27XBRD (SUTURE) ×4 IMPLANT
SYR 50ML LL SCALE MARK (SYRINGE) ×4 IMPLANT
SYRINGE 10CC LL (SYRINGE) ×4 IMPLANT
TOWEL OR 17X26 10 PK STRL BLUE (TOWEL DISPOSABLE) ×8 IMPLANT
TOWEL OR NON WOVEN STRL DISP B (DISPOSABLE) ×4 IMPLANT
TRAP SPECIMEN MUCOUS 40CC (MISCELLANEOUS) IMPLANT
TRAY FOLEY W/METER SILVER 14FR (SET/KITS/TRAYS/PACK) ×4 IMPLANT
TRAY LAPAROSCOPIC (CUSTOM PROCEDURE TRAY) ×4 IMPLANT
TROCAR BLADELESS OPT 5 100 (ENDOMECHANICALS) ×4 IMPLANT
UNDERPAD 30X30 (UNDERPADS AND DIAPERS) ×4 IMPLANT
UNDERPAD 30X30 INCONTINENT (UNDERPADS AND DIAPERS) ×4 IMPLANT
WATER STERILE IRR 1500ML POUR (IV SOLUTION) ×8 IMPLANT

## 2015-08-26 NOTE — Interval H&P Note (Signed)
History and Physical Interval Note:  08/26/2015 7:13 AM  Hannah Woodward  has presented today for surgery, with the diagnosis of ENDOMETRIAL HYPERPLEX COMPLEX   The various methods of treatment have been discussed with the patient and family. After consideration of risks, benefits and other options for treatment, the patient has consented to  Procedure(s): XI ROBOTIC ASSISTED TOTAL HYSTERECTOMY WITH BILATERAL SALPINGO OOPHORECTOMY (Bilateral) POSSIBLE SENTINAL LYMPH NODE BIOPSY (N/A) as a surgical intervention .  The patient's history has been reviewed, patient examined, no change in status, stable for surgery.  I have reviewed the patient's chart and labs.  Questions were answered to the patient's satisfaction.     Donaciano Eva

## 2015-08-26 NOTE — Anesthesia Postprocedure Evaluation (Signed)
Anesthesia Post Note  Patient: Hannah Woodward  Procedure(s) Performed: Procedure(s) (LRB): XI ROBOTIC ASSISTED TOTAL HYSTERECTOMY WITH BILATERAL SALPINGO OOPHORECTOMY; LYSIS OF ADHESIONS (Bilateral)  Patient location during evaluation: PACU Anesthesia Type: General Level of consciousness: awake and alert Pain management: pain level controlled Vital Signs Assessment: post-procedure vital signs reviewed and stable Respiratory status: spontaneous breathing, nonlabored ventilation, respiratory function stable and patient connected to nasal cannula oxygen Cardiovascular status: blood pressure returned to baseline and stable Postop Assessment: no signs of nausea or vomiting Anesthetic complications: no    Last Vitals:  Filed Vitals:   08/26/15 1045 08/26/15 1100  BP: 134/85 138/77  Pulse: 78 79  Temp:  37.1 C  Resp: 15 12    Last Pain:  Filed Vitals:   08/26/15 1104  PainSc: Asleep                 Catalina Gravel

## 2015-08-26 NOTE — H&P (View-Only) (Signed)
Consult Note: Gyn-Onc  Consult was requested by Dr. Garwin Brothers for the evaluation of Hannah Woodward 55 y.o. female  CC:  Chief Complaint  Patient presents with  . Endometrial hyperplasia ,complex    New Consultation    Assessment/Plan:  Hannah Woodward  is a 55 y.o.  year old with CAH. I discussed with her the nature and etiology of CAH. I discussed the options for therapy including medical therapy with progestins and surgical intervention with hysterectomy, BSO, and possible staging. I discussed that there is a 40% risk for co-existing invasive carcinoma. There is a 30% chance for development of carcinoma from [facility].  I am recommending definitive therapy with hysterectomy, BSO and frozen section and SLN biopsy if invasive carcinoma is identified.  We discussed operative risks including  bleeding, infection, damage to internal organs (such as bladder,ureters, bowels), blood clot, reoperation and rehospitalization.   HPI: Hannah Woodward is a very pleasant 55 year old G4P2 who is seen in consultation at the request of Dr Garwin Brothers for [facility].  The patient reports a history of menorrhagia for many years. She has a history of a failed endometrial ablation several years ago. She continue to have ongoing vaginal bleeding that was refractory to all conservative measures and therefore was being prepared for a definitive hysterectomy. 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 To further workup this finding she underwent a D&C in the operating room which revealed CAH. Of note her Pap smear from 06/04/2014 revealed ASCUS that was negative for high-risk HPV subtypes.  The patient is otherwise fairly healthy. She is overweight with a BMI of 34 kg/m. She has a history of 2 prior cesarean sections.   Current Meds:  Outpatient Encounter Prescriptions as of 07/28/2015  Medication Sig  . ibuprofen  (ADVIL,MOTRIN) 800 MG tablet Take 1 tablet (800 mg total) by mouth every 8 (eight) hours as needed for moderate pain.   No facility-administered encounter medications on file as of 07/28/2015.    Allergy:  Allergies  Allergen Reactions  . Phenergan [Promethazine Hcl] Other (See Comments)    Patient did not want phenergan.  Patient states "no control over body movement.  Feels like out of body"    Social Hx:   Social History   Social History  . Marital Status: Divorced    Spouse Name: N/A  . Number of Children: N/A  . Years of Education: N/A   Occupational History  . Not on file.   Social History Main Topics  . Smoking status: Never Smoker   . Smokeless tobacco: Never Used  . Alcohol Use: Yes     Comment: beer- socially  . Drug Use: No  . Sexual Activity: Not Currently    Birth Control/ Protection: None   Other Topics Concern  . Not on file   Social History Narrative    Past Surgical Hx:  Past Surgical History  Procedure Laterality Date  . Cesarean section      x 2  . Esophagogastroduodenoscopy N/A 11/05/2014    Procedure: ESOPHAGOGASTRODUODENOSCOPY (EGD);  Surgeon: Teena Irani, MD;  Location[facility];  Service: Endoscopy;  Laterality: N/A;  [place]ney  . Colonoscopy N/A 11/05/2014    Procedure: COLONOSCOPY;  Surgeon: Teena Irani, MD;  Location: [place];  Service: Endoscopy;  Laterality: N/A;  . Attempted endometrial ablation      [place]  . Dilatation & curettage/hysteroscopy with myosure N/A 05/12/2015  Procedure: [place];  Surgeon: Servando Salina, MD;  Location: [place] [facility];  Service: Gynecology;  Laterality: N/A;    Past Medical Hx:  Past Medical History  Diagnosis Date  . GERD (gastroesophageal reflux disease)     diet controlled, no meds  . Skin cancer     right arm, left chin    Past Gynecological History:  Ablation for menorrhagia. ASCUS pap April, 2017 (HR HPV negative). Cesarean section x2.  No LMP  recorded.  Family Hx: History reviewed. No pertinent family history.  Review of Systems:  Constitutional  Feels well,    ENT Normal appearing ears and nares bilaterally Skin/Breast  No rash, sores, jaundice, itching, dryness Cardiovascular  No chest pain, shortness of breath, or edema  Pulmonary  No cough or wheeze.  Gastro Intestinal  No nausea, vomitting, or diarrhoea. No bright red blood per rectum, no abdominal pain, change in bowel movement, or constipation.  Genito Urinary  No frequency, urgency, dysuria, + menorrhagia Musculo Skeletal  No myalgia, arthralgia, joint swelling or pain  Neurologic  No weakness, numbness, change in gait,  Psychology  No depression, anxiety, insomnia.   Vitals:  Blood pressure 128/76, pulse 78, temperature 98.2 F (36.8 C), temperature source Oral, resp. rate 18, height 5\' 2"  (1.575 m), weight 186 lb 12.8 oz (84.732 kg), SpO2 100 %.  Physical Exam: WD in NAD Neck  Supple NROM, without any enlargements.  Lymph Node Survey No cervical supraclavicular or inguinal adenopathy Cardiovascular  Pulse normal rate, regularity and rhythm. S1 and S2 normal.  Lungs  Clear to auscultation bilateraly, without wheezes/crackles/rhonchi. Good air movement.  Skin  No rash/lesions/breakdown  Psychiatry  Alert and oriented to person, place, and time  Abdomen  Normoactive bowel sounds, abdomen soft, non-tender and obese without evidence of hernia.  Back No CVA tenderness Genito Urinary  Vulva/vagina: Normal external female genitalia.  No lesions. No discharge or bleeding.  Bladder/urethra:  No lesions or masses, well supported bladder  Vagina: normal  Cervix: Normal appearing, no lesions.  Uterus: slightly bulky mobile, no parametrial involvement or nodularity.  Adnexa: no palpable masses. Rectal  Good tone, no masses no cul de sac nodularity.  Extremities  No bilateral cyanosis, clubbing or edema.   Donaciano Eva, MD  07/28/2015, 5:25  PM

## 2015-08-26 NOTE — Transfer of Care (Signed)
Immediate Anesthesia Transfer of Care Note  Patient: Hannah Woodward  Procedure(s) Performed: Procedure(s): XI ROBOTIC ASSISTED TOTAL HYSTERECTOMY WITH BILATERAL SALPINGO OOPHORECTOMY; LYSIS OF ADHESIONS (Bilateral)  Patient Location: PACU  Anesthesia Type:General  Level of Consciousness:  sedated, patient cooperative and responds to stimulation  Airway & Oxygen Therapy:Patient Spontanous Breathing and Patient connected to face mask oxgen  Post-op Assessment:  Report given to PACU RN and Post -op Vital signs reviewed and stable  Post vital signs:  Reviewed and stable  Last Vitals:  Filed Vitals:   08/26/15 0547 08/26/15 1012  BP: 124/76   Pulse: 80   Temp: 37.1 C 37 C  Resp: 16 12    Complications: No apparent anesthesia complications

## 2015-08-26 NOTE — Op Note (Addendum)
OPERATIVE NOTE 08/26/15  Surgeon: Donaciano Eva   Assistants: Dr Lahoma Crocker (an MD assistant was necessary for tissue manipulation, management of robotic instrumentation, retraction and positioning due to the complexity of the case and hospital policies).   Anesthesia: General endotracheal anesthesia  ASA Class: 3   Pre-operative Diagnosis: endometrial complex atypical hyperplasia  Post-operative Diagnosis: same  Operation: Robotic-assisted laparoscopic total hysterectomy with bilateral salpingoophorectomy with lysis of adhesions  Surgeon: Donaciano Eva  Assistant Surgeon: Lahoma Crocker MD  Anesthesia: GET  Urine Output: 300  Operative Findings:  : 12cm uterus densely adherent to anterior uterine wall and omentum throughout the anterior fundus. No CAH or carcinoma seen on frozen section.  Estimated Blood Loss:  less than 50 mL      Total IV Fluids: 400 ml         Specimens: uterus, cervix, bilateral tubes and ovaries.         Complications:  None; patient tolerated the procedure well.         Disposition: PACU - hemodynamically stable.  Procedure Details  The patient was seen in the Holding Room. The risks, benefits, complications, treatment options, and expected outcomes were discussed with the patient.  The patient concurred with the proposed plan, giving informed consent.  The site of surgery properly noted/marked. The patient was identified as Hannah Woodward and the procedure verified as a Robotic-assisted hysterectomy with bilateral salpingo oophorectomy. A Time Out was held and the above information confirmed.  After induction of anesthesia, the patient was draped and prepped in the usual sterile manner. Pt was placed in supine position after anesthesia and draped and prepped in the usual sterile manner. The abdominal drape was placed after the CholoraPrep had been allowed to dry for 3 minutes.  Her arms were tucked to her side with all  appropriate precautions.  The shoulders were stabilized with padded shoulder blocks applied to the acromium processes.  The patient was placed in the semi-lithotomy position in [place].  The perineum was prepped with Betadine. The patient was then prepped. Foley catheter was placed.  A sterile speculum was placed in the vagina.  The cervix was grasped with a single-tooth tenaculum and dilated with Kennon Rounds dilators. 1mg  total of ICG was injected into the cervical stroma at 2 and 9 o'clock at a 28mm depth (concentration 0..5mg /ml). The ZUMI uterine manipulator with a medium colpotomizer ring was placed without difficulty.  A pneum occluder balloon was placed over the manipulator.  OG tube placement was confirmed and to suction.   Next, a 5 mm skin incision was made 1 cm below the subcostal margin in the midclavicular line.  The 5 mm Optiview port and scope was used for direct entry.  Opening pressure was under 10 mm CO2.  The abdomen was insufflated and the findings were noted as above.   At this point and all points during the procedure, the patient's intra-abdominal pressure did not exceed 15 mmHg. Next, a 10 mm skin incision was made in the umbilicus and a right and left port was placed about 10 cm lateral to the robot port on the right and left side.  A fourth arm was placed in the left lower quadrant 2 cm above and superior and medial to the anterior superior iliac spine.  All ports were placed under direct visualization.  The patient was placed in steep Trendelenburg.  Bowel was folded away into the upper abdomen.  The robot was docked in the normal manner.  For 30 minutes sharp and monopolar dissection was employed to perform adhesiolysis between the omentum and uterine fundus, and between the uterine body (anterior) and anterior abdominal wall in the midline pelvis.   A SLN mapping was performed in anticipation of a possible finding of invasive cancer on frozen section. The right and left peritoneum  were opened parallel to the IP ligament to open the retroperitoneal spaces bilaterally. The SLN mapping was performed in bilateral pelvic basins. The para rectal and paravesical spaces were opened up. Lymphatic channels were identified travelling to the following visualized sentinel lymph node's: right obturator and left external iliac. These SLN's were separated from their surrounding lymphatic tissue, removed but held and not sent for pathology pending the findings on frozen section. When this revealed benign disease they were discarded.  The hysterectomy was started after the round ligament on the right side was incised and the retroperitoneum was entered and the pararectal space was developed.  The ureter was noted to be on the medial leaf of the broad ligament.  The peritoneum above the ureter was incised and stretched and the infundibulopelvic ligament was skeletonized, cauterized and cut.  The posterior peritoneum was taken down to the level of the KOH ring.  The anterior peritoneum was also taken down.  The bladder flap was created slowly with substantial care to identify the correct plane due to the extensive anterior adhesions. Meticulous use of sharp and monopolar dissection was employed to dissect the bladder flap to the level of the KOH ring.  The uterine artery on the right side was skeletonized, cauterized and cut in the normal manner.  A similar procedure was performed on the left.  The colpotomy was made and the uterus, cervix, bilateral ovaries and tubes were amputated and delivered through the vagina.  Pedicles were inspected and excellent hemostasis was achieved.    The colpotomy at the vaginal cuff was closed with Vicryl on a CT1 needle in an interrupted figure of eight manner.  Irrigation was used and excellent hemostasis was achieved.  At this point in the procedure was completed.  Robotic instruments were removed under direct visulaization.  The robot was undocked. The 10 mm ports were  closed with Vicryl on a UR-5 needle and the fascia was closed with 0 Vicryl on a UR-5 needle.  The skin was closed with 4-0 Vicryl in a subcuticular manner.  Dermabond was applied.  Sponge, lap and needle counts correct x 2.  The patient was taken to the recovery room in stable condition.  The vagina was swabbed with bleeding noted from vaginal abrasions incurred from specimen delivery from the vagina. These were reapproximated with 3-0 vicryl to reinforce hemostasis..   All instrument and needle counts were correct x  3.   The patient was transferred to the recovery room in a stable condition.  Donaciano Eva, MD

## 2015-08-26 NOTE — Discharge Instructions (Signed)
08/26/2015  Return to work: 4 weeks  Activity: 1. Be up and out of the bed during the day.  Take a nap if needed.  You may walk up steps but be careful and use the hand rail.  Stair climbing will tire you more than you think, you may need to stop part way and rest.   2. No lifting or straining for 6 weeks.  3. No driving for 1-2 weeks.  Do Not drive if you are taking narcotic pain medicine.  4. Shower daily.  Use soap and water on your incision and pat dry; don't rub.   5. No sexual activity and nothing in the vagina for 8 weeks.  Diet: 1. Low sodium Heart Healthy Diet is recommended.  2. It is safe to use a laxative if you have difficulty moving your bowels.   Wound Care: 1. Keep clean and dry.  Shower daily.  Reasons to call the Doctor:   Fever - Oral temperature greater than 100.4 degrees Fahrenheit  Foul-smelling vaginal discharge  Difficulty urinating  Nausea and vomiting  Increased pain at the site of the incision that is unrelieved with pain medicine.  Difficulty breathing with or without chest pain  New calf pain especially if only on one side  Sudden, continuing increased vaginal bleeding with or without clots.   Follow-up: 1. See Everitt Amber in 3-4 weeks.  Contacts: For questions or concerns you should contact:  Dr. Everitt Amber at [phone]  or at [place]

## 2015-08-26 NOTE — Anesthesia Procedure Notes (Addendum)
Procedure Name: Intubation Date/Time: 08/26/2015 7:38 AM Performed by: Montel Clock Pre-anesthesia Checklist: Patient identified, Emergency Drugs available, Suction available, Patient being monitored and Timeout performed Patient Re-evaluated:Patient Re-evaluated prior to inductionOxygen Delivery Method: Circle system utilized Preoxygenation: Pre-oxygenation with 100% oxygen Intubation Type: IV induction Ventilation: Mask ventilation without difficulty and Oral airway inserted - appropriate to patient size Laryngoscope Size: 3 and Glidescope Grade View: Grade II Tube type: Oral Tube size: 7.5 mm Number of attempts: 4 Airway Equipment and Method: Bougie stylet and Video-laryngoscopy Placement Confirmation: ETT inserted through vocal cords under direct vision,  positive ETCO2 and breath sounds checked- equal and bilateral Secured at: 21 cm Tube secured with: Tape Dental Injury: Teeth and Oropharynx as per pre-operative assessment and Injury to lip  Difficulty Due To: Difficult Airway- due to immobile epiglottis, Difficult Airway- due to anterior larynx and Difficulty was unanticipated Future Recommendations: Recommend- induction with short-acting agent, and alternative techniques readily available Comments: After IV induction, mask ventilation required oral airway. Attempt x2 by CRNA with MAC 3, Miller 2 blades with Grade IV view. Attempt x1 by MDA with MAC 3 with Grade IV view. Anterior glottis and large tonsils.  Glidescope by MDA, unable to advance ETT with glidescope stylet.  Attempt by MDA with glidescope and bougie with success.  ETT advanced over bougie via seldinger technique.  Confirmed +BBS, +EtCO2, +bilateral chest rise.

## 2015-08-26 NOTE — Anesthesia Preprocedure Evaluation (Addendum)
Anesthesia Evaluation  Patient identified by MRN, date of birth, ID band Patient awake    Reviewed: Allergy & Precautions, NPO status , Patient's Chart, lab work & pertinent test results  Airway Mallampati: III  TM Distance: <3 FB Neck ROM: Full    Dental  (+) Teeth Intact, Dental Advisory Given   Pulmonary neg pulmonary ROS,    Pulmonary exam normal breath sounds clear to auscultation       Cardiovascular negative cardio ROS Normal cardiovascular exam Rhythm:Regular Rate:Normal     Neuro/Psych negative neurological ROS  negative psych ROS   GI/Hepatic Neg liver ROS, GERD (diet-controlled)  Controlled,  Endo/Other  Obesity   Renal/GU negative Renal ROS     Musculoskeletal negative musculoskeletal ROS (+)   Abdominal   Peds  Hematology negative hematology ROS (+)   Anesthesia Other Findings Day of surgery medications reviewed with the patient.  Reproductive/Obstetrics ENDOMETRIAL HYPERPLEX COMPLEX                            Anesthesia Physical Anesthesia Plan  ASA: II  Anesthesia Plan: General   Post-op Pain Management:    Induction: Intravenous  Airway Management Planned: Oral ETT  Additional Equipment:   Intra-op Plan:   Post-operative Plan: Extubation in OR  Informed Consent: I have reviewed the patients History and Physical, chart, labs and discussed the procedure including the risks, benefits and alternatives for the proposed anesthesia with the patient or authorized representative who has indicated his/her understanding and acceptance.   Dental advisory given  Plan Discussed with: CRNA  Anesthesia Plan Comments: (Risks/benefits of general anesthesia discussed with patient including risk of damage to teeth, lips, gum, and tongue, nausea/vomiting, allergic reactions to medications, and the possibility of heart attack, stroke and death.  All patient questions answered.   Patient wishes to proceed.  2 PIVs)        Anesthesia Quick Evaluation

## 2015-08-26 NOTE — Addendum Note (Signed)
Addendum  created 08/26/15 1345 by Montel Clock, CRNA   Modules edited: Anesthesia Medication Administration

## 2015-08-27 DIAGNOSIS — N8311 Corpus luteum cyst of right ovary: Secondary | ICD-10-CM | POA: Diagnosis not present

## 2015-08-27 DIAGNOSIS — N879 Dysplasia of cervix uteri, unspecified: Secondary | ICD-10-CM | POA: Diagnosis not present

## 2015-08-27 DIAGNOSIS — E669 Obesity, unspecified: Secondary | ICD-10-CM | POA: Diagnosis not present

## 2015-08-27 DIAGNOSIS — N736 Female pelvic peritoneal adhesions (postinfective): Secondary | ICD-10-CM | POA: Diagnosis not present

## 2015-08-27 DIAGNOSIS — Z6833 Body mass index (BMI) 33.0-33.9, adult: Secondary | ICD-10-CM | POA: Diagnosis not present

## 2015-08-27 DIAGNOSIS — Z85828 Personal history of other malignant neoplasm of skin: Secondary | ICD-10-CM | POA: Diagnosis not present

## 2015-08-27 DIAGNOSIS — D259 Leiomyoma of uterus, unspecified: Secondary | ICD-10-CM | POA: Diagnosis not present

## 2015-08-27 DIAGNOSIS — N8312 Corpus luteum cyst of left ovary: Secondary | ICD-10-CM | POA: Diagnosis not present

## 2015-08-27 LAB — BASIC METABOLIC PANEL
ANION GAP: 6 (ref 5–15)
BUN: 8 mg/dL (ref 6–20)
CALCIUM: 8.4 mg/dL — AB (ref 8.9–10.3)
CO2: 25 mmol/L (ref 22–32)
Chloride: 107 mmol/L (ref 101–111)
Creatinine, Ser: 0.68 mg/dL (ref 0.44–1.00)
GFR calc Af Amer: >60 mL/min (ref >60)
GLUCOSE: 128 mg/dL — AB (ref 65–99)
Potassium: 3.8 mmol/L (ref 3.5–5.1)
Sodium: 138 mmol/L (ref 135–145)

## 2015-08-27 LAB — CBC
HCT: 32.1 % — ABNORMAL LOW (ref 36.0–46.0)
Hemoglobin: 10.6 g/dL — ABNORMAL LOW (ref 12.0–15.0)
MCH: 24.6 pg — ABNORMAL LOW (ref 26.0–34.0)
MCHC: 33 g/dL (ref 30.0–36.0)
MCV: 74.5 fL — AB (ref 78.0–100.0)
PLATELETS: 297 10*3/uL (ref 150–400)
RBC: 4.31 MIL/uL (ref 3.87–5.11)
RDW: 18.6 % — AB (ref 11.5–15.5)
WBC: 9.9 10*3/uL (ref 4.0–10.5)

## 2015-08-27 MED ORDER — OXYCODONE-ACETAMINOPHEN 5-325 MG PO TABS: 1.0000 | ORAL_TABLET | ORAL | Status: AC | PRN

## 2015-08-27 MED ORDER — DOCUSATE SODIUM 100 MG PO CAPS: 100.0000 mg | ORAL_CAPSULE | Freq: Two times a day (BID) | ORAL | Status: DC

## 2015-08-27 MED FILL — OXYCODONE/APAP 5/325MG: 5-325 | 3 days supply | Qty: 30 | Fill #0

## 2015-08-27 NOTE — Progress Notes (Signed)
Discharge instructions reviewed with with patient utilizing teach back method. Patient discharged to home

## 2015-08-27 NOTE — Discharge Summary (Signed)
Physician Discharge Summary  Patient ID: Hannah Woodward MRN: [id number] DOB/AGE: 19-Mar-1960 55 y.o.  Admit date: 08/26/2015 Discharge date: 08/27/2015  Admission Diagnoses: <principal problem not specified>  Discharge Diagnoses:  Active Problems:   Complex endometrial hyperplasia with atypia   Difficult airway   Discharged Condition: good  Hospital Course: patient was admitted on 08/26/15 for robotic hysterectomy, BSO for CAH of the endometrium. No invasive cancer was found on frozen section. Lysis of adhesions were performed to separated the densely adherent uterus to the anterior abdominal wall. Surgery was uncomplicated. Postoperatively the patient did well. She had some mild hypotension, but no tachycardia. Her postop Hb was appropriate and did not signify active bleeding. She has minimal pain.  Consults: None  Significant Diagnostic Studies: labs:  CBC    Component Value Date/Time   WBC 9.9 08/27/2015 0417   RBC 4.31 08/27/2015 0417   HGB 10.6* 08/27/2015 0417   HCT 32.1* 08/27/2015 0417   PLT 297 08/27/2015 0417   MCV 74.5* 08/27/2015 0417   MCH 24.6* 08/27/2015 0417   MCHC 33.0 08/27/2015 0417   RDW 18.6* 08/27/2015 0417   LYMPHSABS 2.0 08/22/2015 1500   MONOABS 0.6 08/22/2015 1500   EOSABS 0.2 08/22/2015 1500   BASOSABS 0.0 08/22/2015 1500     Treatments: surgery: see above  Discharge Exam: Blood pressure 95/57, pulse 60, temperature 98.2 F (36.8 C), temperature source Oral, resp. rate 12, height 5\' 3"  (1.6 m), weight 186 lb (84.369 kg), last menstrual period 08/22/2015, SpO2 96 %. General appearance: alert and cooperative Resp: clear to auscultation bilaterally Cardio: regular rate and rhythm, S1, S2 normal, no murmur, click, rub or gallop GI: soft, non-tender; bowel sounds normal; no masses,  no organomegaly and mildly distended Incision/Wound: clean and dry x 5  Disposition: 01-Home or Self Care  Discharge Instructions    (HEART FAILURE PATIENTS) Call  MD:  Anytime you have any of the following symptoms: 1) 3 pound weight gain in 24 hours or 5 pounds in 1 week 2) shortness of breath, with or without a dry hacking cough 3) swelling in the hands, feet or stomach 4) if you have to sleep on extra pillows at night in order to breathe.    Complete by:  As directed      Call MD for:  difficulty breathing, headache or visual disturbances    Complete by:  As directed      Call MD for:  extreme fatigue    Complete by:  As directed      Call MD for:  hives    Complete by:  As directed      Call MD for:  persistant dizziness or light-headedness    Complete by:  As directed      Call MD for:  persistant nausea and vomiting    Complete by:  As directed      Call MD for:  redness, tenderness, or signs of infection (pain, swelling, redness, odor or green/yellow discharge around incision site)    Complete by:  As directed      Call MD for:  severe uncontrolled pain    Complete by:  As directed      Call MD for:  temperature >100.4    Complete by:  As directed      Diet - low sodium heart healthy    Complete by:  As directed      Diet general    Complete by:  As directed  Driving Restrictions    Complete by:  As directed   No driving for 7 days or until off narcotic pain medication     Increase activity slowly    Complete by:  As directed      Remove dressing in 24 hours    Complete by:  As directed      Sexual Activity Restrictions    Complete by:  As directed   No intercourse for 6 weeks            Medication List    TAKE these medications        docusate sodium 100 MG capsule  Commonly known as:  COLACE  Take 1 capsule (100 mg total) by mouth 2 (two) times daily.     ibuprofen 800 MG tablet  Commonly known as:  ADVIL,MOTRIN  Take 1 tablet (800 mg total) by mouth every 8 (eight) hours as needed for moderate pain.     oxyCODONE-acetaminophen 5-325 MG tablet  Commonly known as:  PERCOCET/ROXICET  Take 1-2 tablets by mouth every  4 (four) hours as needed (moderate to severe pain (when tolerating fluids)).           Follow-up Information    Follow up with Donaciano Eva, MD In 3 weeks.   Specialty:  Obstetrics and Gynecology   Contact information:   [place] [address] [id number] [phone]       Signed: Donaciano Eva 08/27/2015, 9:41 AM

## 2015-08-28 ENCOUNTER — Telehealth: Payer: Self-pay | Admitting: Gynecologic Oncology

## 2015-08-28 NOTE — Telephone Encounter (Signed)
Patient doing well post-op.  Minimal pain, diet tolerated, bowels and bladder functioning.  Informed of final path results.  Follow up appt arranged.  Advised to call for any needs or concerns.

## 2015-09-01 ENCOUNTER — Telehealth: Payer: Self-pay

## 2015-09-01 NOTE — Telephone Encounter (Signed)
Incoming call from a patient of Dr Terrence Dupont Rossi's , patient is post-op day six for hysterectomy with BSO for CAH . Patient states she had some "pink' drainage "oozing" from the umbilical site . Patient states it increased throughout the day and has now decreased . Patient just inquiring if this is normal and wondering if she needs to be seen. Dr Everitt Amber was updated , patient needs to monitor the drainage and call with an increase of drainage and any new onset of symptoms. Patient states understanding , denies further questions at this time.

## 2015-09-22 MED FILL — VENLAFAXINE HCL ER 37.5 MG: 37.5 | 30 days supply | Qty: 30 | Fill #0

## 2015-09-26 ENCOUNTER — Other Ambulatory Visit (HOSPITAL_COMMUNITY): Payer: Self-pay | Admitting: Family Medicine

## 2015-09-26 DIAGNOSIS — G4730 Sleep apnea, unspecified: Secondary | ICD-10-CM | POA: Diagnosis not present

## 2015-09-26 DIAGNOSIS — E049 Nontoxic goiter, unspecified: Secondary | ICD-10-CM | POA: Diagnosis not present

## 2015-09-26 DIAGNOSIS — Z800 Family history of malignant neoplasm of digestive organs: Secondary | ICD-10-CM | POA: Diagnosis not present

## 2015-09-26 DIAGNOSIS — Z0000 Encounter for general adult medical examination without abnormal findings: Secondary | ICD-10-CM | POA: Diagnosis not present

## 2015-09-29 ENCOUNTER — Encounter: Payer: Self-pay | Admitting: Gynecologic Oncology

## 2015-09-29 ENCOUNTER — Ambulatory Visit: Payer: 59 | Attending: Gynecologic Oncology | Admitting: Gynecologic Oncology

## 2015-09-29 VITALS — BP 111/74 | HR 78 | Temp 98.2°F | Resp 18 | Ht 63.0 in | Wt 176.0 lb

## 2015-09-29 DIAGNOSIS — E8941 Symptomatic postprocedural ovarian failure: Secondary | ICD-10-CM

## 2015-09-29 DIAGNOSIS — Z9889 Other specified postprocedural states: Secondary | ICD-10-CM | POA: Diagnosis not present

## 2015-09-29 DIAGNOSIS — Z90710 Acquired absence of both cervix and uterus: Secondary | ICD-10-CM | POA: Insufficient documentation

## 2015-09-29 DIAGNOSIS — Z90722 Acquired absence of ovaries, bilateral: Secondary | ICD-10-CM | POA: Insufficient documentation

## 2015-09-29 DIAGNOSIS — N8502 Endometrial intraepithelial neoplasia [EIN]: Secondary | ICD-10-CM | POA: Diagnosis not present

## 2015-09-29 NOTE — Patient Instructions (Signed)
Plan to follow up with Dr. Garwin Brothers for your annual well woman check.  Please call for any questions or concerns.

## 2015-09-29 NOTE — Progress Notes (Signed)
POSTOPERATIVE FOLLOW-UP  CC: complex atypical hyperplasia of endometrium  Assessment:    55 y.o. year old with a history of CAH.   S/p robotic assisted total hysterectomy, BSO on 08/26/15.   Plan: 1) Pathology reports reviewed today 2) Treatment counseling - I discussed the benign nature of the pathology. No specific follow-up is needed. 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 Trialing venlafaxine. If this fails to control symptoms and those symptoms are quality of life limiting she is a candidate to consider estrogen replacement for less than 5 years.  She was given the opportunity to ask questions, which were answered to her satisfaction, and she is agreement with the above mentioned plan of care.  3)  Return to clinic on a prn basis. She will return to see Dr Garwin Brothers annually for well-woman care.  HPI:  Hannah Woodward is a 55 y.o. year old No obstetric history on file. initially seen in consultation on 07/28/15 referred by Dr Garwin Brothers for [facility].  She then underwent a robotic hysterectomy, BSO on [id number] without complications though her uterus was densely adherent to the anterior abdominal wall.  Her postoperative course was uncomplicated.  Her final pathology revealed complex atypical hyperplasia with no invasive cancer.  She is seen today for a postoperative check and to discuss her pathology results and ongoing plan.  Since discharge from the hospital, she is feeling well.  She has improving appetite, normal bowel and bladder function, and pain controlled with minimal PO medication. She has no other complaints today.    Review of systems: Constitutional:  She has no weight gain or weight loss. She has no fever or chills. Eyes: No blurred vision Ears, Nose, Mouth, Throat: No dizziness, headaches or changes in hearing. No mouth sores. Cardiovascular: No chest pain, palpitations or edema. + hot flashes Respiratory:  No shortness of breath, wheezing or cough Gastrointestinal: She has  normal bowel movements without diarrhea or constipation. She denies any nausea or vomiting. She denies blood in her stool or heart burn. Genitourinary:  She denies pelvic pain, pelvic pressure or changes in her urinary function. She has no hematuria, dysuria, or incontinence. She has no irregular vaginal bleeding or vaginal discharge Musculoskeletal: Denies muscle weakness or joint pains.  Skin:  She has no skin changes, rashes or itching Neurological:  Denies dizziness or headaches. No neuropathy, no numbness or tingling. Psychiatric:  She denies depression or anxiety. Hematologic/Lymphatic:   No easy bruising or bleeding   Physical Exam: Blood pressure 111/74, pulse 78, temperature 98.2 F (36.8 C), temperature source Oral, resp. rate 18, height 5\' 3"  (1.6 m), weight 176 lb (79.8 kg), SpO2 100 %. General: Well dressed, well nourished in no apparent distress.   HEENT:  Normocephalic and atraumatic, no lesions.  Extraocular muscles intact. Sclerae anicteric. Pupils equal, round, reactive. No mouth sores or ulcers. Thyroid is normal size, not nodular, midline. Abdomen:  Soft, nontender, nondistended.  No palpable masses.  No hepatosplenomegaly.  No ascites. Normal bowel sounds.  No hernias.  Incisions are well healed Genitourinary: Normal EGBUS  Vaginal cuff intact.  No bleeding or discharge.  No cul de sac fullness. Extremities: No cyanosis, clubbing or edema.  No calf tenderness or erythema. No palpable cords. Psychiatric: Mood and affect are appropriate. Neurological: Awake, alert and oriented x 3. Sensation is intact, no neuropathy.  Musculoskeletal: No pain, normal strength and range of motion.  Donaciano Eva, MD

## 2015-10-01 DIAGNOSIS — Z0100 Encounter for examination of eyes and vision without abnormal findings: Secondary | ICD-10-CM | POA: Diagnosis not present

## 2015-10-08 ENCOUNTER — Ambulatory Visit (HOSPITAL_COMMUNITY)
Admission: RE | Admit: 2015-10-08 | Discharge: 2015-10-08 | Disposition: A | Discharge status: Home / Self Care | Payer: 59 | Source: Ambulatory Visit | Attending: Family Medicine | Admitting: Family Medicine

## 2015-10-08 DIAGNOSIS — E042 Nontoxic multinodular goiter: Secondary | ICD-10-CM | POA: Diagnosis not present

## 2015-10-08 DIAGNOSIS — E049 Nontoxic goiter, unspecified: Secondary | ICD-10-CM

## 2015-10-09 MED FILL — MINIVELLE 0.1 MG PATCH: 0.1 | 28 days supply | Qty: 8 | Fill #0

## 2015-11-05 ENCOUNTER — Other Ambulatory Visit: Payer: Self-pay | Admitting: Family Medicine

## 2015-11-05 DIAGNOSIS — E041 Nontoxic single thyroid nodule: Secondary | ICD-10-CM

## 2015-11-06 MED FILL — MINIVELLE 0.1 MG PATCH: 0.1 | 28 days supply | Qty: 8 | Fill #1

## 2015-12-18 ENCOUNTER — Ambulatory Visit
Admission: RE | Admit: 2015-12-18 | Discharge: 2015-12-18 | Disposition: A | Discharge status: Home / Self Care | Payer: 59 | Source: Ambulatory Visit | Attending: Family Medicine | Admitting: Family Medicine

## 2015-12-18 ENCOUNTER — Other Ambulatory Visit (HOSPITAL_COMMUNITY)
Admission: RE | Admit: 2015-12-18 | Discharge: 2015-12-18 | Disposition: A | Discharge status: Home / Self Care | Payer: 59 | Source: Ambulatory Visit | Attending: Diagnostic Radiology | Admitting: Diagnostic Radiology

## 2015-12-18 DIAGNOSIS — E041 Nontoxic single thyroid nodule: Secondary | ICD-10-CM | POA: Insufficient documentation

## 2015-12-31 ENCOUNTER — Other Ambulatory Visit (HOSPITAL_BASED_OUTPATIENT_CLINIC_OR_DEPARTMENT_OTHER): Payer: Self-pay

## 2015-12-31 DIAGNOSIS — G4730 Sleep apnea, unspecified: Secondary | ICD-10-CM

## 2015-12-31 DIAGNOSIS — R0683 Snoring: Secondary | ICD-10-CM

## 2016-01-16 ENCOUNTER — Ambulatory Visit (HOSPITAL_BASED_OUTPATIENT_CLINIC_OR_DEPARTMENT_OTHER): Payer: 59 | Attending: Family Medicine | Admitting: Internal Medicine

## 2016-01-16 VITALS — Ht 62.0 in | Wt 180.0 lb

## 2016-01-16 DIAGNOSIS — R0683 Snoring: Secondary | ICD-10-CM | POA: Diagnosis not present

## 2016-01-16 DIAGNOSIS — G4730 Sleep apnea, unspecified: Secondary | ICD-10-CM

## 2016-01-16 DIAGNOSIS — G4733 Obstructive sleep apnea (adult) (pediatric): Secondary | ICD-10-CM | POA: Diagnosis not present

## 2016-01-19 DIAGNOSIS — E049 Nontoxic goiter, unspecified: Secondary | ICD-10-CM | POA: Diagnosis not present

## 2016-01-24 DIAGNOSIS — G4730 Sleep apnea, unspecified: Secondary | ICD-10-CM | POA: Diagnosis not present

## 2016-01-24 NOTE — Procedures (Signed)
   Patient Name: Hannah Woodward, Hannah Woodward Date: 01/16/2016 Gender: Female D.O.B: February 26, 1960 Age (years): 55 Referring Provider: Kelton Pillar Height (inches): 18 Interpreting Physician: Baird Lyons MD, ABSM Weight (lbs): 180 RPSGT: Baxter Flattery BMI: 33 MRN: [id number] Neck Size: 14.00 CLINICAL INFORMATION Sleep Study Type: NPSG  Indication for sleep study: Fatigue, Snoring, Witnessed Apneas  Epworth Sleepiness Score: 8  SLEEP STUDY TECHNIQUE As per the AASM Manual for the Scoring of Sleep and Associated Events v2.3 (April 2016) with a hypopnea requiring 4% desaturations.  The channels recorded and monitored were frontal, central and occipital EEG, electrooculogram (EOG), submentalis EMG (chin), nasal and oral airflow, thoracic and abdominal wall motion, anterior tibialis EMG, snore microphone, electrocardiogram, and pulse oximetry.  MEDICATIONS Medications self-administered by patient taken the night of the study : none reported  SLEEP ARCHITECTURE The study was initiated at 10:17:13 PM and ended at 5:12:14 AM.  Sleep onset time was 14.0 minutes and the sleep efficiency was 85.4%. The total sleep time was 354.5 minutes.  Stage REM latency was 101.5 minutes.  The patient spent 5.78% of the night in stage N1 sleep, 63.19% in stage N2 sleep, 0.14% in stage N3 and 30.89% in REM.  Alpha intrusion was absent.  Supine sleep was 23.78%.  RESPIRATORY PARAMETERS The overall apnea/hypopnea index (AHI) was 0.2 per hour. There were 0 total apneas, including 0 obstructive, 0 central and 0 mixed apneas. There were 1 hypopneas and 1 RERAs.  The AHI during Stage REM sleep was 0.0 per hour.  AHI while supine was 0.7 per hour.  The mean oxygen saturation was 94.76%. The minimum SpO2 during sleep was 89.00%.  Moderate snoring was noted during this study.  CARDIAC DATA The 2 lead EKG demonstrated sinus rhythm. The mean heart rate was 72.37 beats per minute. Other EKG findings  include: PVCs. LEG MOVEMENT DATA The total PLMS were 0 with a resulting PLMS index of 0.00. Associated arousal with leg movement index was 0.0 .  IMPRESSIONS - No significant obstructive sleep apnea occurred during this study (AHI = 0.2/h). - No significant central sleep apnea occurred during this study (CAI = 0.0/h). - Mild oxygen desaturation was noted during this study (Min O2 = 89.00%). - The patient snored with Moderate snoring volume. - No cardiac abnormalities were noted during this study. - Clinically significant periodic limb movements did not occur during sleep. No significant associated arousals.  DIAGNOSIS - Primary Snoring (786.09 [R06.83 ICD-10])  RECOMMENDATIONS - Avoid alcohol, sedatives and other CNS depressants that may worsen sleep apnea and disrupt normal sleep architecture. - Sleep hygiene should be reviewed to assess factors that may improve sleep quality. - Weight management and regular exercise should be initiated or continued if appropriate.  [Electronically signed] 01/24/2016 11:03 AM  Baird Lyons MD, ABSM Diplomate, American Board of Sleep Medicine   NPI: [id number]  Carlos, American Board of Sleep Medicine  ELECTRONICALLY SIGNED ON:  01/24/2016, 11:01 AM [place] PH: [phone]   FX: [phone] [place]

## 2016-04-01 MED FILL — IBUPROFEN 800 MG TABLET: 800 | 10 days supply | Qty: 30 | Fill #1

## 2016-05-27 ENCOUNTER — Other Ambulatory Visit: Payer: Self-pay | Admitting: Obstetrics and Gynecology

## 2016-05-28 DIAGNOSIS — Z6833 Body mass index (BMI) 33.0-33.9, adult: Secondary | ICD-10-CM | POA: Diagnosis not present

## 2016-05-28 DIAGNOSIS — Z01419 Encounter for gynecological examination (general) (routine) without abnormal findings: Secondary | ICD-10-CM | POA: Diagnosis not present

## 2016-06-05 IMAGING — DX DG LUMBAR SPINE 2-3V
3 series · 3 of 3 positions shown · non-contrast
Comparison: None.

CLINICAL DATA: Right-sided back pain

EXAM:
LUMBAR SPINE - 2-3 VIEW

[l-spine ap]
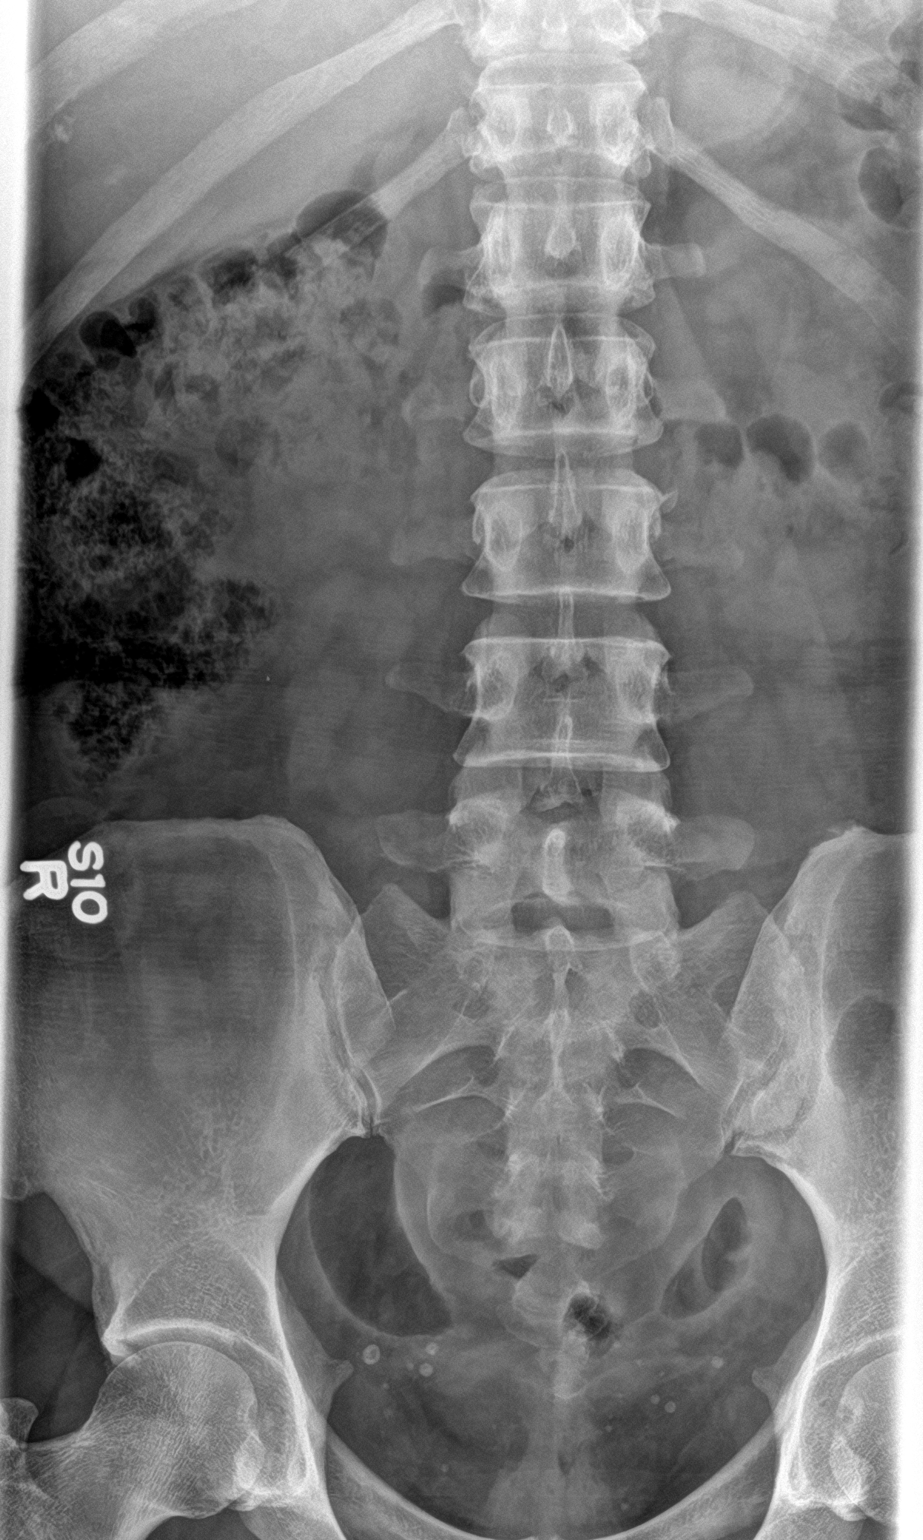

[l-spine lat]
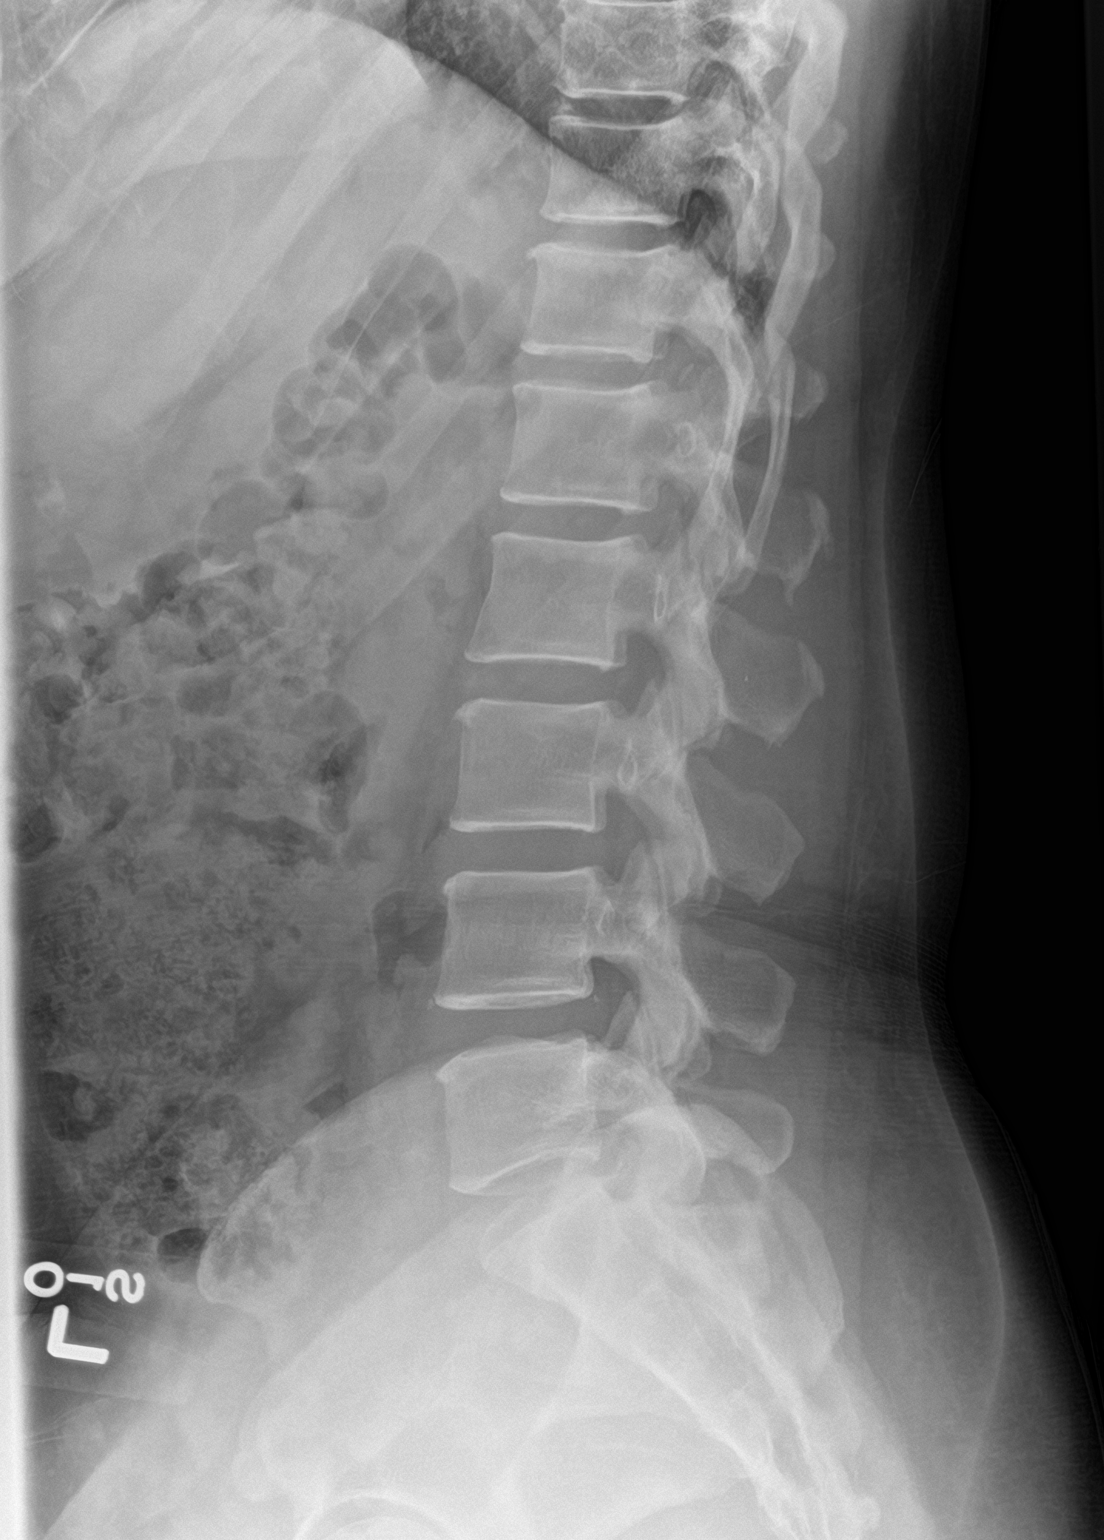

[l-spine spot]
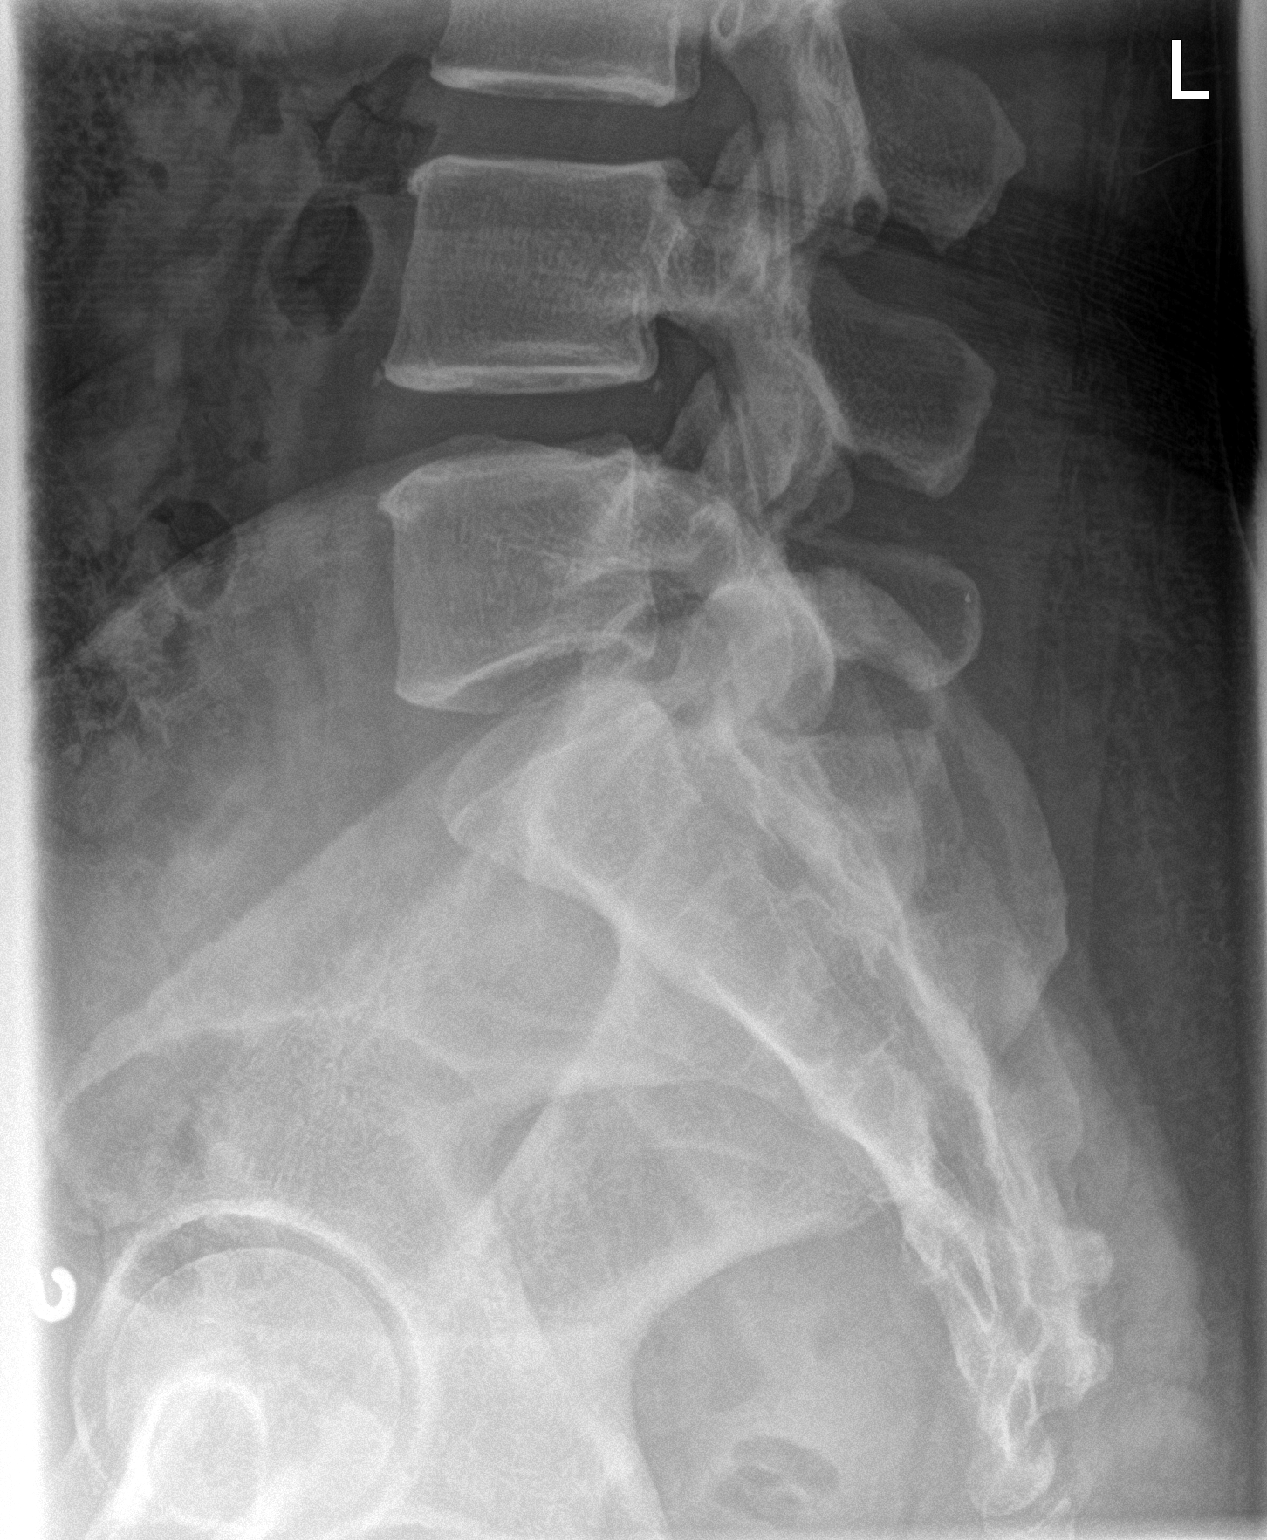

[3 of 3 positions shown; findings below may reference images not displayed]

FINDINGS: Normal alignment. No fracture or pars defect. No mass lesion. Disc
spaces maintained with minimal anterior spurring consistent with
minimal disc degeneration.
IMPRESSION: Negative

## 2016-07-11 DIAGNOSIS — J019 Acute sinusitis, unspecified: Secondary | ICD-10-CM | POA: Diagnosis not present

## 2016-07-28 MED FILL — MINIVELLE 0.1 MG PATCH: 0.1 | 28 days supply | Qty: 8 | Fill #0

## 2016-08-13 DIAGNOSIS — Z1231 Encounter for screening mammogram for malignant neoplasm of breast: Secondary | ICD-10-CM | POA: Diagnosis not present

## 2016-08-30 ENCOUNTER — Other Ambulatory Visit: Payer: Self-pay | Admitting: Surgery

## 2016-08-30 DIAGNOSIS — E049 Nontoxic goiter, unspecified: Secondary | ICD-10-CM

## 2016-09-09 MED FILL — MINIVELLE 0.1 MG PATCH: 0.1 | 28 days supply | Qty: 8 | Fill #1

## 2016-10-01 DIAGNOSIS — Z131 Encounter for screening for diabetes mellitus: Secondary | ICD-10-CM | POA: Diagnosis not present

## 2016-10-01 DIAGNOSIS — Z13220 Encounter for screening for lipoid disorders: Secondary | ICD-10-CM | POA: Diagnosis not present

## 2016-10-01 DIAGNOSIS — E049 Nontoxic goiter, unspecified: Secondary | ICD-10-CM | POA: Diagnosis not present

## 2016-10-01 DIAGNOSIS — Z0000 Encounter for general adult medical examination without abnormal findings: Secondary | ICD-10-CM | POA: Diagnosis not present

## 2016-10-01 DIAGNOSIS — Z800 Family history of malignant neoplasm of digestive organs: Secondary | ICD-10-CM | POA: Diagnosis not present

## 2016-10-01 DIAGNOSIS — G4730 Sleep apnea, unspecified: Secondary | ICD-10-CM | POA: Diagnosis not present

## 2016-10-20 MED FILL — MINIVELLE 0.1 MG PATCH: 0.1 | 28 days supply | Qty: 8 | Fill #2

## 2016-11-30 MED FILL — MINIVELLE 0.1 MG PATCH: 0.1 | 28 days supply | Qty: 8 | Fill #3

## 2017-01-14 MED FILL — ESTRADIOL 0.1 MG/24HR PTTW: 0.1 | 28 days supply | Qty: 8 | Fill #4

## 2017-01-21 DIAGNOSIS — K08432 Partial loss of teeth due to caries, class II: Secondary | ICD-10-CM | POA: Diagnosis not present

## 2017-01-21 DIAGNOSIS — K05321 Chronic periodontitis, generalized, slight: Secondary | ICD-10-CM | POA: Diagnosis not present

## 2017-01-21 DIAGNOSIS — K001 Supernumerary teeth: Secondary | ICD-10-CM | POA: Diagnosis not present

## 2017-01-21 DIAGNOSIS — R930 Abnormal findings on diagnostic imaging of skull and head, not elsewhere classified: Secondary | ICD-10-CM | POA: Diagnosis not present

## 2017-02-25 MED FILL — ESTRADIOL 0.1 MG PATCH: 0.1 | 28 days supply | Qty: 8 | Fill #0

## 2017-04-18 MED FILL — MINIVELLE 0.1 MG PATCH: 0.1 | 28 days supply | Qty: 8 | Fill #0

## 2017-04-21 MED FILL — IBUPROFEN 800 MG TAB: 800 | 7 days supply | Qty: 30 | Fill #0

## 2017-04-21 MED FILL — AMOXICILLIN 875 MG TABLET: 875 | 10 days supply | Qty: 20 | Fill #0

## 2017-06-11 IMAGING — US US THYROID BIOPSY
1 series · 13 of 19 positions shown · non-contrast
Comparison: 10/08/2015

MEDICATIONS:
None

COMPLICATIONS:
None immediate.

INDICATION: Indeterminate thyroid nodule. 1.6 cm nodule in the right thyroid
lobe.

EXAM:
ULTRASOUND GUIDED FINE NEEDLE ASPIRATION OF INDETERMINATE THYROID
NODULE
TECHNIQUE: Informed written consent was obtained from the patient after a
discussion of the risks, benefits and alternatives to treatment.
Questions regarding the procedure were encouraged and answered. A
timeout was performed prior to the initiation of the procedure.

[Series 1: us thyroid biopsy · 0.08mm/px · 19 acquisitions, 13 frames shown]
[im 1/19]
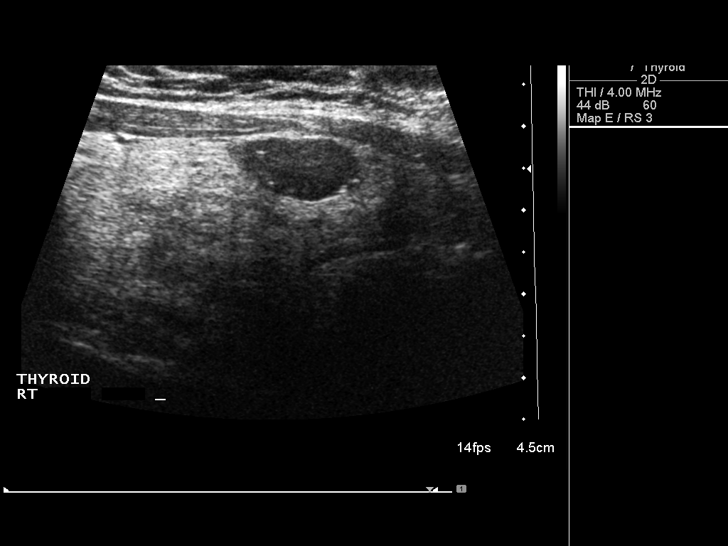
[im 3/19]
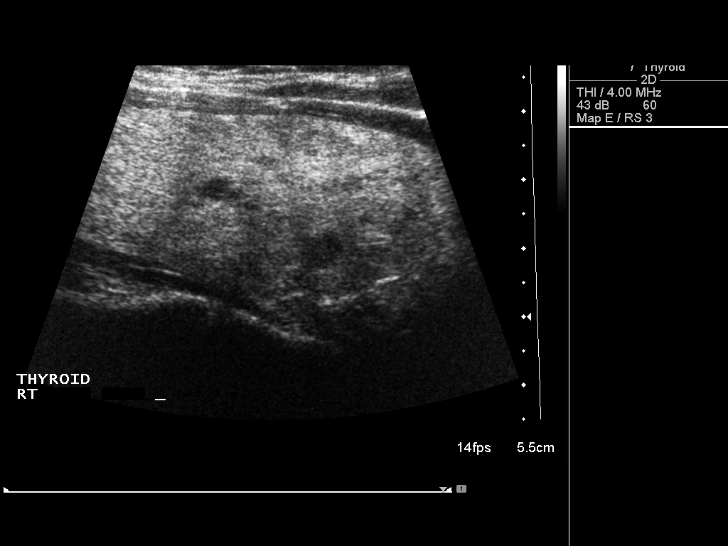
[im 4/19]
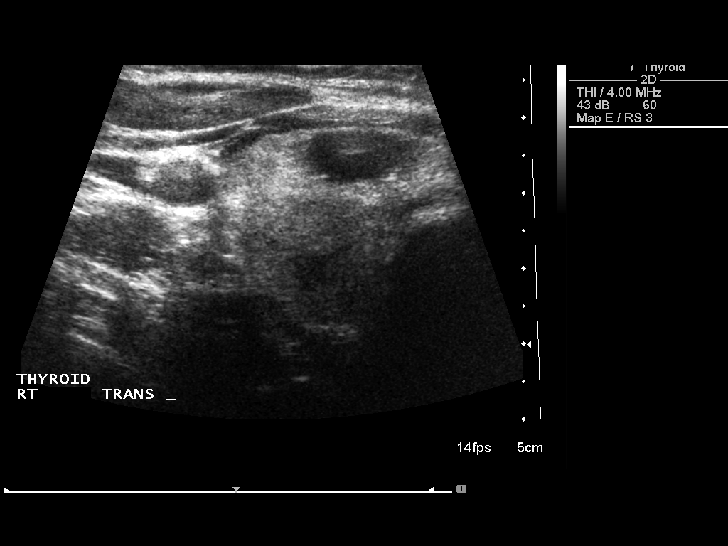
[im 6/19]
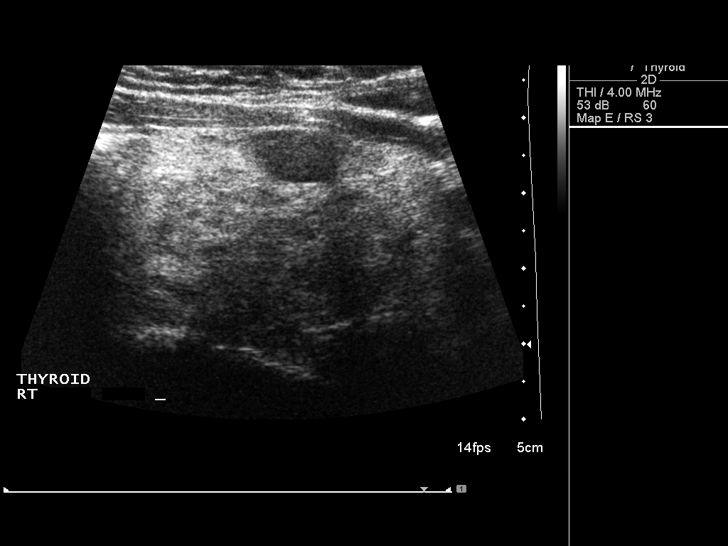
[im 7/19]
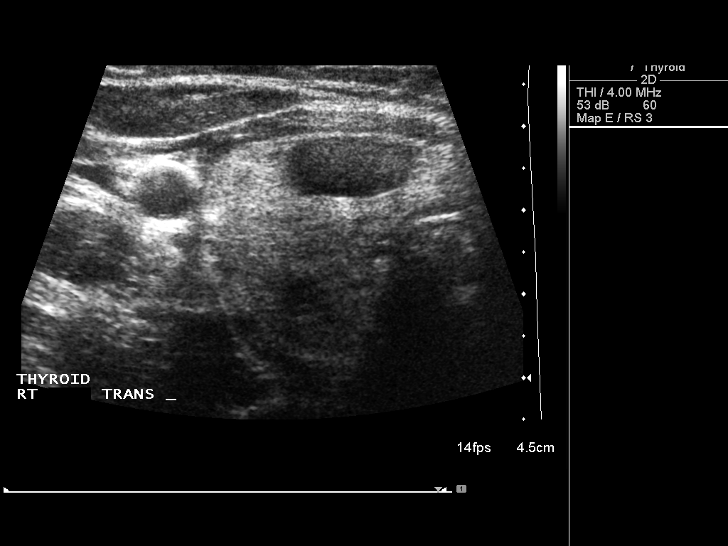
[im 9/19]
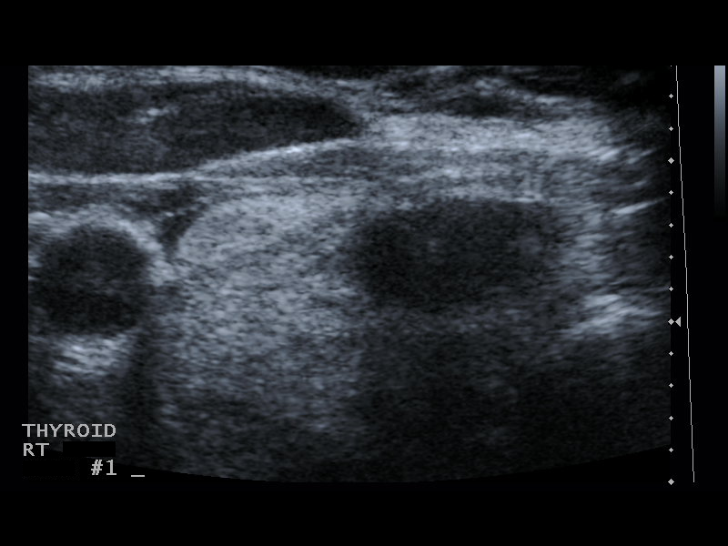
[im 10/19]
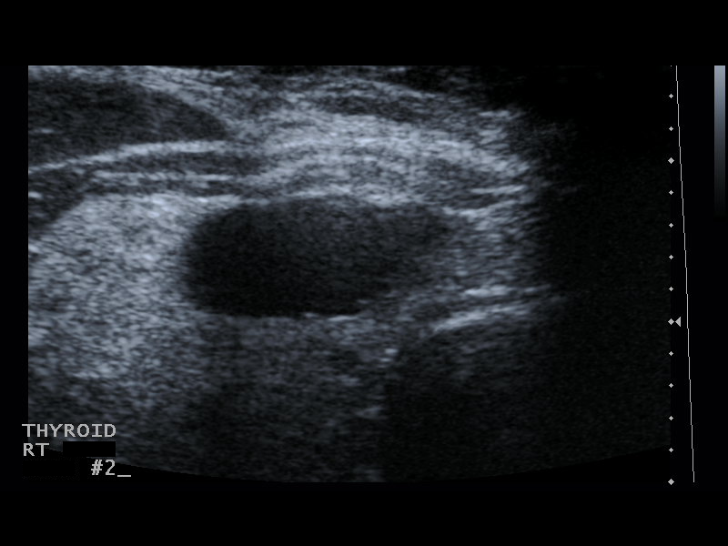
[im 11/19]
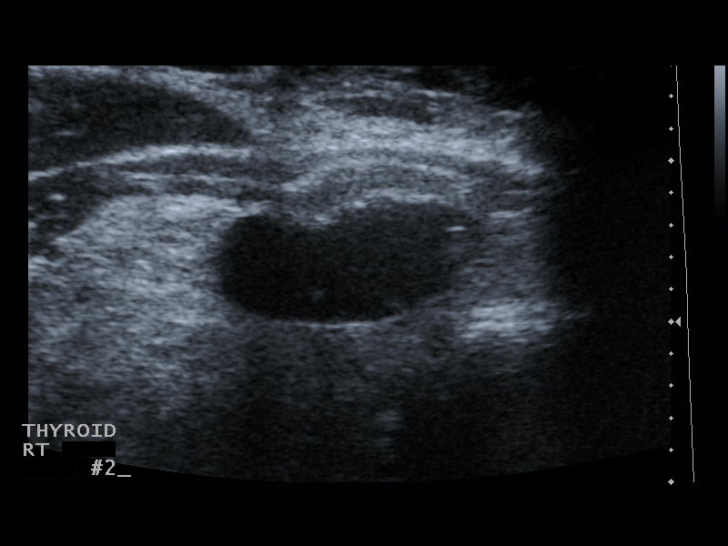
[im 13/19]
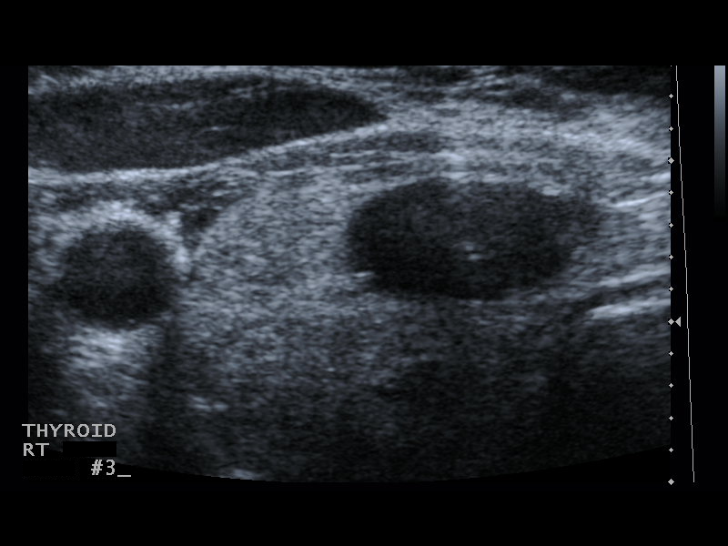
[im 14/19]
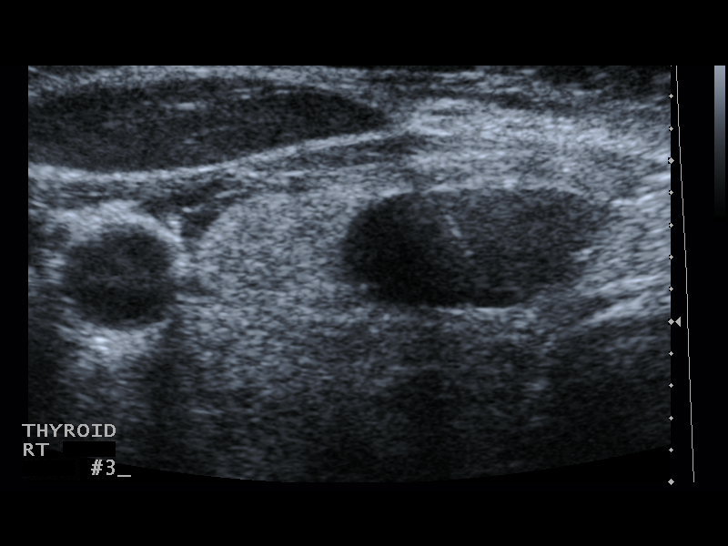
[im 16/19]
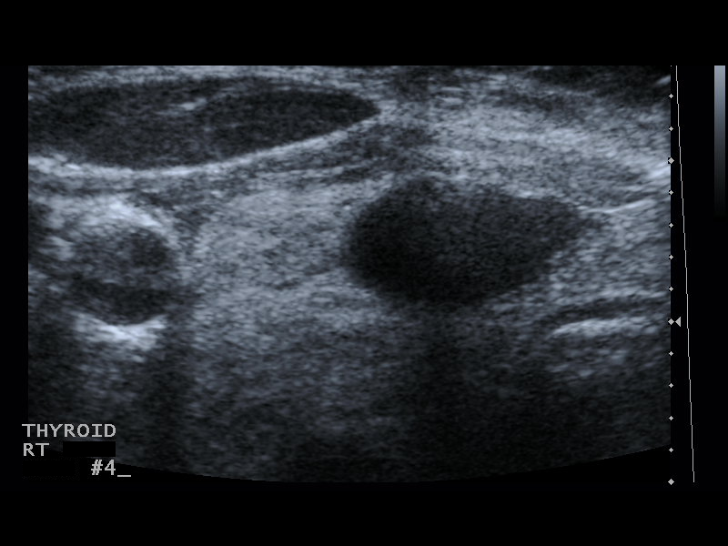
[im 17/19]
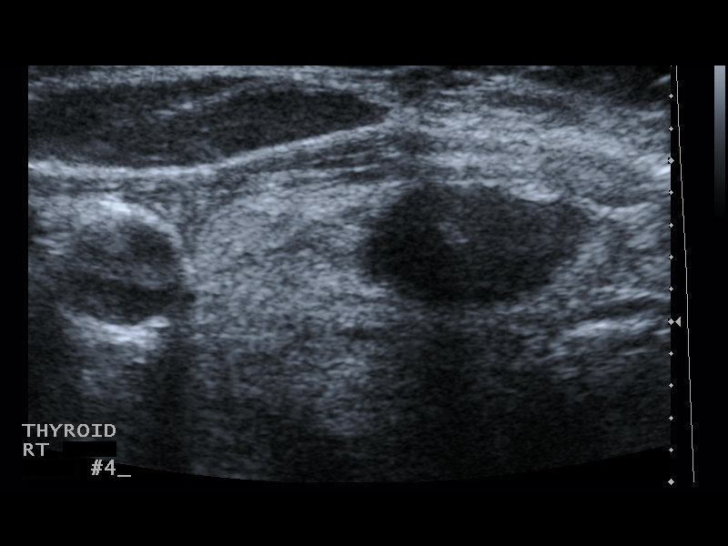
[im 19/19]
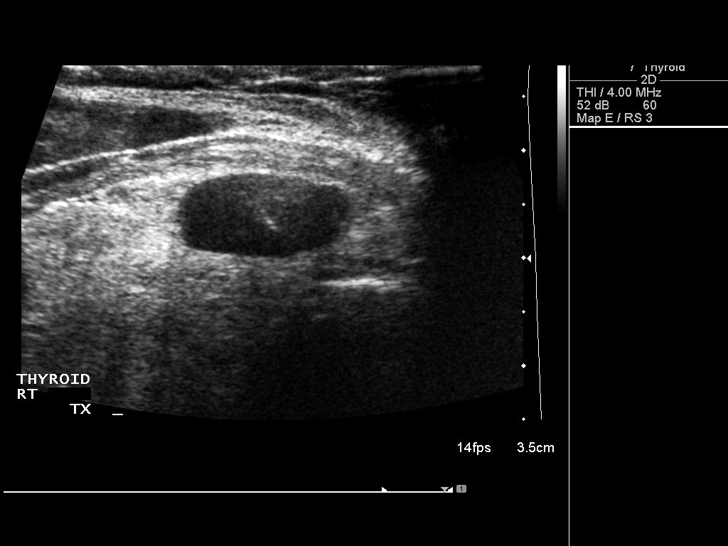

[13 of 19 positions shown; findings below may reference images not displayed]

Pre-procedural ultrasound scanning demonstrated unchanged size and
appearance of the indeterminate nodule within the inferior right
thyroid lobe.

The procedure was planned. The neck was prepped in the usual sterile
fashion, and a sterile drape was applied covering the operative
field. A timeout was performed prior to the initiation of the
procedure. Local anesthesia was provided with 1% lidocaine.

Under direct ultrasound guidance, 3 FNA biopsies were performed of
the right inferior nodule with a 25 gauge needle. One FNA was
obtained with a 22 gauge needle. Multiple ultrasound images were
saved for procedural documentation purposes. The samples were
prepared and submitted to pathology.

Limited post procedural scanning was negative for hematoma or
additional complication. Dressings were placed. The patient
tolerated the above procedures procedure well without immediate
postprocedural complication.
FINDINGS: FINDINGS
Nodule reference number based on prior diagnostic ultrasound: 1

Maximum size: 1.6 cm

Location: Right  ;  Inferior

ACR TI-RADS total points: 7

ACR TI-RADS risk category:  TR5 (>/= 7 points)

Prior biopsy:  No

Reason for biopsy: meets ACR TI-RADS criteria

Ultrasound imaging confirms appropriate placement of the needles
within the thyroid nodule.
IMPRESSION: Technically successful ultrasound guided fine needle aspiration of
right inferior thyroid nodule.

## 2017-06-16 MED FILL — ESTRADIOL 0.1 MG PATCH: 0.1 | 28 days supply | Qty: 8 | Fill #1

## 2017-08-10 MED FILL — ESTRADIOL 0.1 MG PATCH: 0.1 | 28 days supply | Qty: 8 | Fill #2

## 2017-10-05 MED FILL — ESTRADIOL 0.1 MG PATCH: 0.1 | 28 days supply | Qty: 8 | Fill #3

## 2017-12-27 MED FILL — DOTTI 0.1 MG/24HR PTTW: 0.1 | 28 days supply | Qty: 8 | Fill #0

## 2018-01-25 MED FILL — DOTTI 0.1 MG/24HR PTTW: 0.1 | 28 days supply | Qty: 8 | Fill #1
